# Patient Record
Sex: Male | Born: 1935 | Race: White | Hispanic: No | Smoking: Former smoker
Health system: Southern US, Community
[De-identification: ages and names within clinical notes are randomized; demographics above are authoritative.]

## PROBLEM LIST (undated history)

## (undated) DIAGNOSIS — J449 Chronic obstructive pulmonary disease, unspecified: Secondary | ICD-10-CM

## (undated) DIAGNOSIS — E785 Hyperlipidemia, unspecified: Secondary | ICD-10-CM

## (undated) DIAGNOSIS — C61 Malignant neoplasm of prostate: Secondary | ICD-10-CM

## (undated) DIAGNOSIS — L97509 Non-pressure chronic ulcer of other part of unspecified foot with unspecified severity: Secondary | ICD-10-CM

## (undated) DIAGNOSIS — I519 Heart disease, unspecified: Secondary | ICD-10-CM

## (undated) DIAGNOSIS — I10 Essential (primary) hypertension: Secondary | ICD-10-CM

## (undated) DIAGNOSIS — I5189 Other ill-defined heart diseases: Secondary | ICD-10-CM

## (undated) DIAGNOSIS — I2510 Atherosclerotic heart disease of native coronary artery without angina pectoris: Secondary | ICD-10-CM

## (undated) HISTORY — DX: Essential (primary) hypertension: I10

## (undated) HISTORY — DX: Non-pressure chronic ulcer of other part of unspecified foot with unspecified severity: L97.509

## (undated) HISTORY — DX: Chronic obstructive pulmonary disease, unspecified: J44.9

## (undated) HISTORY — DX: Malignant neoplasm of prostate: C61

## (undated) HISTORY — DX: Atherosclerotic heart disease of native coronary artery without angina pectoris: I25.10

## (undated) HISTORY — DX: Hyperlipidemia, unspecified: E78.5

## (undated) HISTORY — DX: Heart disease, unspecified: I51.9

## (undated) HISTORY — DX: Other ill-defined heart diseases: I51.89

---

## 1994-12-12 HISTORY — PX: CORONARY ARTERY BYPASS GRAFT: SHX141

## 1998-05-11 ENCOUNTER — Other Ambulatory Visit: Admission: RE | Admit: 1998-05-11 | Discharge: 1998-05-11 | Payer: Self-pay | Admitting: Cardiovascular Disease

## 2000-12-12 HISTORY — PX: CARDIAC CATHETERIZATION: SHX172

## 2000-12-12 HISTORY — PX: COLONOSCOPY W/ POLYPECTOMY: SHX1380

## 2001-01-17 ENCOUNTER — Ambulatory Visit (HOSPITAL_COMMUNITY): Admission: RE | Admit: 2001-01-17 | Discharge: 2001-01-18 | Payer: Self-pay | Admitting: Cardiovascular Disease

## 2001-09-03 ENCOUNTER — Encounter (INDEPENDENT_AMBULATORY_CARE_PROVIDER_SITE_OTHER): Payer: Self-pay | Admitting: *Deleted

## 2001-09-03 ENCOUNTER — Ambulatory Visit (HOSPITAL_COMMUNITY): Admission: RE | Admit: 2001-09-03 | Discharge: 2001-09-03 | Payer: Self-pay | Admitting: Gastroenterology

## 2001-10-01 ENCOUNTER — Encounter: Payer: Self-pay | Admitting: General Surgery

## 2001-10-01 ENCOUNTER — Encounter: Admission: RE | Admit: 2001-10-01 | Discharge: 2001-10-01 | Payer: Self-pay | Admitting: General Surgery

## 2001-10-02 ENCOUNTER — Ambulatory Visit (HOSPITAL_BASED_OUTPATIENT_CLINIC_OR_DEPARTMENT_OTHER): Admission: RE | Admit: 2001-10-02 | Discharge: 2001-10-02 | Payer: Self-pay | Admitting: General Surgery

## 2001-10-02 ENCOUNTER — Encounter (INDEPENDENT_AMBULATORY_CARE_PROVIDER_SITE_OTHER): Payer: Self-pay | Admitting: *Deleted

## 2005-08-10 ENCOUNTER — Ambulatory Visit: Payer: Self-pay | Admitting: Internal Medicine

## 2005-09-21 ENCOUNTER — Ambulatory Visit: Payer: Self-pay | Admitting: Internal Medicine

## 2005-11-02 ENCOUNTER — Ambulatory Visit: Payer: Self-pay | Admitting: Internal Medicine

## 2005-11-24 ENCOUNTER — Ambulatory Visit: Payer: Self-pay | Admitting: Internal Medicine

## 2005-12-26 ENCOUNTER — Encounter: Admission: RE | Admit: 2005-12-26 | Discharge: 2005-12-26 | Payer: Self-pay | Admitting: Gynecology

## 2006-01-17 ENCOUNTER — Ambulatory Visit: Payer: Self-pay | Admitting: Internal Medicine

## 2006-02-17 ENCOUNTER — Ambulatory Visit: Payer: Self-pay | Admitting: Internal Medicine

## 2006-04-19 ENCOUNTER — Ambulatory Visit: Payer: Self-pay | Admitting: Internal Medicine

## 2006-05-02 ENCOUNTER — Ambulatory Visit: Payer: Self-pay | Admitting: Gastroenterology

## 2006-05-03 ENCOUNTER — Ambulatory Visit: Payer: Self-pay | Admitting: Gastroenterology

## 2006-05-03 ENCOUNTER — Ambulatory Visit: Payer: Self-pay | Admitting: Internal Medicine

## 2006-07-04 ENCOUNTER — Ambulatory Visit: Payer: Self-pay | Admitting: Internal Medicine

## 2008-03-18 ENCOUNTER — Ambulatory Visit: Payer: Self-pay | Admitting: Internal Medicine

## 2008-03-18 DIAGNOSIS — I10 Essential (primary) hypertension: Secondary | ICD-10-CM | POA: Insufficient documentation

## 2008-03-18 DIAGNOSIS — K219 Gastro-esophageal reflux disease without esophagitis: Secondary | ICD-10-CM

## 2008-03-18 DIAGNOSIS — R05 Cough: Secondary | ICD-10-CM | POA: Insufficient documentation

## 2008-03-18 DIAGNOSIS — J449 Chronic obstructive pulmonary disease, unspecified: Secondary | ICD-10-CM

## 2008-03-18 DIAGNOSIS — E785 Hyperlipidemia, unspecified: Secondary | ICD-10-CM | POA: Insufficient documentation

## 2008-03-24 ENCOUNTER — Telehealth (INDEPENDENT_AMBULATORY_CARE_PROVIDER_SITE_OTHER): Payer: Self-pay | Admitting: *Deleted

## 2008-04-23 ENCOUNTER — Ambulatory Visit: Payer: Self-pay | Admitting: Internal Medicine

## 2008-05-07 ENCOUNTER — Ambulatory Visit: Payer: Self-pay | Admitting: Internal Medicine

## 2008-06-04 ENCOUNTER — Ambulatory Visit: Payer: Self-pay | Admitting: Internal Medicine

## 2008-07-08 ENCOUNTER — Ambulatory Visit: Payer: Self-pay | Admitting: Internal Medicine

## 2008-07-08 DIAGNOSIS — J441 Chronic obstructive pulmonary disease with (acute) exacerbation: Secondary | ICD-10-CM | POA: Insufficient documentation

## 2008-10-21 ENCOUNTER — Ambulatory Visit: Payer: Self-pay | Admitting: Internal Medicine

## 2008-11-03 ENCOUNTER — Telehealth: Payer: Self-pay | Admitting: Adult Health

## 2008-11-04 ENCOUNTER — Ambulatory Visit: Payer: Self-pay | Admitting: Internal Medicine

## 2008-11-14 ENCOUNTER — Telehealth (INDEPENDENT_AMBULATORY_CARE_PROVIDER_SITE_OTHER): Payer: Self-pay | Admitting: *Deleted

## 2008-11-20 ENCOUNTER — Ambulatory Visit: Payer: Self-pay | Admitting: Internal Medicine

## 2008-11-22 ENCOUNTER — Emergency Department (HOSPITAL_COMMUNITY): Admission: EM | Admit: 2008-11-22 | Discharge: 2008-11-22 | Payer: Self-pay | Admitting: Emergency Medicine

## 2008-12-15 ENCOUNTER — Ambulatory Visit: Payer: Self-pay | Admitting: Internal Medicine

## 2009-02-16 ENCOUNTER — Ambulatory Visit: Payer: Self-pay | Admitting: Internal Medicine

## 2009-02-27 ENCOUNTER — Telehealth (INDEPENDENT_AMBULATORY_CARE_PROVIDER_SITE_OTHER): Payer: Self-pay | Admitting: *Deleted

## 2009-06-26 ENCOUNTER — Telehealth (INDEPENDENT_AMBULATORY_CARE_PROVIDER_SITE_OTHER): Payer: Self-pay | Admitting: *Deleted

## 2009-06-26 ENCOUNTER — Ambulatory Visit: Payer: Self-pay | Admitting: Internal Medicine

## 2009-07-27 ENCOUNTER — Encounter: Payer: Self-pay | Admitting: Internal Medicine

## 2009-11-03 ENCOUNTER — Ambulatory Visit: Payer: Self-pay | Admitting: Internal Medicine

## 2010-01-22 ENCOUNTER — Telehealth (INDEPENDENT_AMBULATORY_CARE_PROVIDER_SITE_OTHER): Payer: Self-pay | Admitting: *Deleted

## 2010-01-27 ENCOUNTER — Telehealth (INDEPENDENT_AMBULATORY_CARE_PROVIDER_SITE_OTHER): Payer: Self-pay | Admitting: *Deleted

## 2010-01-28 ENCOUNTER — Ambulatory Visit: Payer: Self-pay | Admitting: Internal Medicine

## 2010-01-28 ENCOUNTER — Telehealth: Payer: Self-pay | Admitting: Internal Medicine

## 2010-02-21 ENCOUNTER — Telehealth (INDEPENDENT_AMBULATORY_CARE_PROVIDER_SITE_OTHER): Payer: Self-pay | Admitting: *Deleted

## 2010-02-22 ENCOUNTER — Telehealth (INDEPENDENT_AMBULATORY_CARE_PROVIDER_SITE_OTHER): Payer: Self-pay | Admitting: *Deleted

## 2010-02-23 ENCOUNTER — Ambulatory Visit: Payer: Self-pay | Admitting: Internal Medicine

## 2010-03-01 ENCOUNTER — Telehealth (INDEPENDENT_AMBULATORY_CARE_PROVIDER_SITE_OTHER): Payer: Self-pay | Admitting: *Deleted

## 2010-03-02 ENCOUNTER — Ambulatory Visit: Payer: Self-pay | Admitting: Internal Medicine

## 2010-03-02 ENCOUNTER — Ambulatory Visit: Payer: Self-pay | Admitting: Cardiovascular Disease

## 2010-03-11 ENCOUNTER — Ambulatory Visit: Payer: Self-pay | Admitting: Internal Medicine

## 2010-04-13 ENCOUNTER — Ambulatory Visit (HOSPITAL_COMMUNITY): Admission: RE | Admit: 2010-04-13 | Discharge: 2010-04-13 | Payer: Self-pay | Admitting: Urology

## 2010-04-29 ENCOUNTER — Ambulatory Visit: Payer: Self-pay | Admitting: Internal Medicine

## 2010-05-07 ENCOUNTER — Ambulatory Visit: Admission: RE | Admit: 2010-05-07 | Discharge: 2010-05-24 | Payer: Self-pay | Admitting: Radiation Oncology

## 2010-07-05 ENCOUNTER — Telehealth: Payer: Self-pay | Admitting: Internal Medicine

## 2010-07-05 ENCOUNTER — Ambulatory Visit: Payer: Self-pay | Admitting: Internal Medicine

## 2010-07-27 ENCOUNTER — Ambulatory Visit
Admission: RE | Admit: 2010-07-27 | Discharge: 2010-10-14 | Payer: Self-pay | Source: Home / Self Care | Admitting: Radiation Oncology

## 2010-08-23 ENCOUNTER — Telehealth (INDEPENDENT_AMBULATORY_CARE_PROVIDER_SITE_OTHER): Payer: Self-pay | Admitting: *Deleted

## 2010-09-06 ENCOUNTER — Ambulatory Visit: Payer: Self-pay | Admitting: Internal Medicine

## 2010-09-13 ENCOUNTER — Ambulatory Visit: Payer: Self-pay | Admitting: Cardiovascular Disease

## 2010-09-22 ENCOUNTER — Telehealth (INDEPENDENT_AMBULATORY_CARE_PROVIDER_SITE_OTHER): Payer: Self-pay | Admitting: *Deleted

## 2010-10-26 ENCOUNTER — Ambulatory Visit: Payer: Self-pay | Admitting: Pulmonary Disease

## 2010-10-26 ENCOUNTER — Telehealth (INDEPENDENT_AMBULATORY_CARE_PROVIDER_SITE_OTHER): Payer: Self-pay | Admitting: *Deleted

## 2010-11-23 ENCOUNTER — Telehealth (INDEPENDENT_AMBULATORY_CARE_PROVIDER_SITE_OTHER): Payer: Self-pay | Admitting: *Deleted

## 2011-01-05 ENCOUNTER — Ambulatory Visit: Payer: Self-pay | Admitting: Cardiovascular Disease

## 2011-01-07 ENCOUNTER — Ambulatory Visit
Admission: RE | Admit: 2011-01-07 | Discharge: 2011-01-07 | Payer: Self-pay | Source: Home / Self Care | Attending: Internal Medicine | Admitting: Internal Medicine

## 2011-01-13 NOTE — Assessment & Plan Note (Signed)
Summary: Pulmonary/ acute ov with hfa @ 100%   Primary Provider/Referring Provider:  Jacky Kindle MD, [facility]  CC:  Acute visit.  Pt c/o wheezing and increased SOB x 4 days.  He gets out of breath just walking approx 200 ft.  Past few days has used combivent 3 times a day- not helping like it used to.Marland Kitchen  History of Present Illness:  77-yowm quit smoking age 77  with chronic cough and evidence of reversible air flow obstruction by PFTs who did not improve on Spiriva, but seemed much better in terms of dyspnea on Advair, but worse in terms of hoarseness.    Chart review did indicate that he had egd with  large hiatal hernia with esophageal stricture and GERD and Dr. Jarold Motto recommended that he maintain on Nexium beforeakfast daily,  which he had not been doing consistently so rec PPI perfectly regularly and switched from Advair to Symbicort and returned 06/04/08  stating that his breathing was the best it's been in years and his hoarseness is also much better.  February 23, 2010 Acute visit.  Pt c/o prod cough with white to yellow sputum x 4 days.  He states that when he breaths his chest "rattles".  He had rx for amocicillin 500 mg and started taking this bid for the past 2 days. rec change to combivent and 6 days prednisone.  March 02, 2010 Acute visit.  Pt states that he was doing better with breathing until finished prednisone taper.  He c/o increased SOB x 2 days.  He also c/o increased cough- non prod.  CT was therefore done and neg.     Apr 29, 2010 follow up, pt states his bretahing is better still have SOB with activity, pt states he was just diagnosed with protstate  cancer and wants to know if that will affect his bretahing any bc of the radiation adn the hormone shots he will be getting.  sleeping good only needs combivent around 5pm, no am exac or sign cough.    July 05, 2010 Acute visit.  Pt c/o wheezing and increased SOB x 4 days.  He gets out of breath just walking approx 200 ft.  Past  few days has used combivent 3 times a day- not helping like it used to. no purulent sputum. Pt denies any significant sore throat, dysphagia, itching, sneezing,  nasal congestion or excess secretions,  fever, chills, sweats, unintended wt loss, pleuritic or exertional cp, hempoptysis,  orthopnea pnd or leg swelling   Current Medications (verified): 1)  Coq-10 30 Mg  Caps (Coenzyme Q10) .... Once Daily 2)  Cvs Childrens Complete 60 Mg Chew (Pediatric Multivit-Minerals-C) .Marland Kitchen.. 1 Once Daily 3)  Omeprazole 20 Mg Tbec (Omeprazole) .... Take 1 Tablet By Mouth Once A Day 30 Minutes Before Meal 4)  Metoprolol Tartrate 50 Mg Tabs (Metoprolol Tartrate) .... 1/2 Tab By Mouth Twice Daily 5)  Simvastatin 20 Mg  Tabs (Simvastatin) .... Once Daily 6)  Hydrochlorothiazide 25 Mg Tabs (Hydrochlorothiazide) .... 1/2 Tab By Mouth Once Daily 7)  Benicar 20 Mg  Tabs (Olmesartan Medoxomil) .... 1/2 Once Daily 8)  Symbicort 160-4.5 Mcg/act  Aero (Budesonide-Formoterol Fumarate) .... Two Puffs Each Am and 2 Puffs Each Pm 9)  Adult Aspirin Low Strength 81 Mg  Tbdp (Aspirin) .... Once Daily 10)  Acidophilus Probiotic Blend  Caps (Misc Intestinal Flora Regulat) .Marland Kitchen.. 1 Once Daily As Needed 11)  Mucinex Dm 30-600 Mg Xr12h-Tab (Dextromethorphan-Guaifenesin) .Marland Kitchen.. 1-2 Every 12 Hours As Needed 12)  Loratadine 10 Mg Tabs (Loratadine) .... Take 1 Tablet By Mouth Once A Day As Needed Seasonal Allergies 13)  Pepcid 20 Mg Tabs (Famotidine) .... One At Bedtime 14)  Gabapentin 100 Mg Caps (Gabapentin) .Marland Kitchen.. 1 Once Daily As Needed 15)  Vitamin D 1000 Unit Tabs (Cholecalciferol) .Marland Kitchen.. 1 Once Daily 16)  Combivent 18-103 Mcg/act Aero (Ipratropium-Albuterol) .... 2 Puffs As Needed For Short of Breath,  Cough Wheeze Congestion 17)  Lexapro 10 Mg Tabs (Escitalopram Oxalate) .... Once Daily  Allergies (verified): No Known Drug Allergies  Past History:  Past Medical History: GERD  (ICD-530.81)...................................................................Marland KitchenJarold Motto     - See positive endoscopy report 05/03/2006 showing esophageal stricture and hiatal hernia CAD (ICD-414.00) HYPERTENSION (ICD-401.9) HYPERLIPIDEMIA (ICD-272.4) COUGH, CHRONIC (ICD-786.2)     - Sinus CT neg March 02, 2010  COPD (ICD-496)........................................................................Marland KitchenWert    - PFTs 06/04/08 FEV1 51% ratio 36% diffusing capacity 74% 12% response to bronchodilator    - HFA 75% January 28, 2010 >  February 23, 2010 75% > 100% March 02, 2010 > confirmed July 05, 2010  Complex med regimen    -Meds reviewed with pt education and computerized med calendar completed/ -December 15, 2008, adjusted March 11, 2010      Vital Signs:  Patient profile:   77 year old male Weight:      169 pounds O2 Sat:      97 % on Room air Temp:     98.0 degrees F oral Pulse rate:   75 / minute BP sitting:   116 / 80  (left arm)  Vitals Entered By: Vernie Murders (July 05, 2010 11:55 AM)  O2 Flow:  Room air  Physical Exam  Additional Exam:  GEN: A/Ox3; pleasant , NAD wt 176 > 172 February 23, 2010 > 171 March 02, 2010 >>172 March 11, 2010> 168 Apr 29, 2010 > 169 July 05, 2010  HEENT:  [place]/AT, , EACs-clear, TMs-wnl, NOSE-clear, THROAT-clear NECK:  Supple w/ fair ROM; no JVD; normal carotid impulses w/o bruits; no thyromegaly or nodules palpated; no lymphadenopathy. RESP Distant bs, no wheeze, mild increase T exp  CARD:  RRR, no m/r/g   GI:   Soft & nt; nml bowel sounds; no organomegaly or masses detected. Musco: Warm bil,  no calf tenderness edema, clubbing, pulses intact     Impression & Recommendations:  Problem # 1:  CHRONIC OBSTRUCTIVE PULMONARY DISEASE, ACUTE EXACERBATION (ICD-491.21)  I spent extra time with the patient today explaining optimal mdi  technique.  This improved from  75- 100%   Each maintenance medication was reviewed in detail including most importantly the  difference between maintenance and as needed and under what circumstances the prns are to be used. This was done in the context of a medication calendar review which provided the patient with a user-friendly unambiguous mechanism for medication administration and reconciliation and provides an action plan for all active problems. It is critical that this be shown to every doctor  for modification during the office visit if necessary so the patient can use it as a working document.  See instructions for specific recommendations   Orders: Est. Patient Level III (40981)  Medications Added to Medication List This Visit: 1)  Cvs Childrens Complete 60 Mg Chew (Pediatric multivit-minerals-c) .Marland Kitchen.. 1 once daily 2)  Prednisone 10 Mg Tabs (Prednisone) .... 4 each am x 2days, 2x2days, 1x2days and stop  Other Orders: Prescription Created Electronically [phone])  Patient Instructions: 1)  Prednisone 4 each am x 2days, 2x2days,  1x2days and stop - if not improving call for office visit  Prescriptions: PREDNISONE 10 MG  TABS (PREDNISONE) 4 each am x 2days, 2x2days, 1x2days and stop  #14 x 0   Entered and Authorized by:   Nyoka Cowden MD   Signed by:   Nyoka Cowden MD on 07/05/2010   Method used:   Electronically to        [facility]* (retail)       [address]       [place], [place]  [id number]       Ph: [phone]       Fax: [phone]   RxID:   [id number]

## 2011-01-13 NOTE — Assessment & Plan Note (Signed)
Summary: Acute NP office visit - COPD   Primary Provider/Referring Provider:  Jacky Kindle MD, [facility]  CC:  wheezing, increased SOB, prod cough w/ yellow mucus, and occ f/c/s x3days - was given zpak yesterday.Marland Kitchen  History of Present Illness:  77-yowm quit smoking age 77  with chronic cough and evidence of reversible air flow obstruction by PFTs who did not improve on Spiriva, but seemed much better in terms of dyspnea on Advair, but worse in terms of hoarseness.    Chart review did indicate that he had egd with  large hiatal hernia with esophageal stricture and GERD and Dr. Jarold Motto recommended that he maintain on Nexium beforeakfast daily,  which he had not been doing consistently so rec PPI perfectly regularly and switched from Advair to Symbicort and returned 06/04/08  stating that his breathing was the best it's been in years and his hoarseness is also much better.  February 23, 2010 Acute visit.  Pt c/o prod cough with white to yellow sputum x 4 days.  He states that when he breaths his chest "rattles".  He had rx for amocicillin 500 mg and started taking this bid for the past 2 days. rec change to combivent and 6 days prednisone.  March 02, 2010 Acute visit.  Pt states that he was doing better with breathing until finished prednisone taper.  He c/o increased SOB x 2 days.  He also c/o increased cough- non prod.  CT was therefore done and neg.     Apr 29, 2010 follow up, pt states his bretahing is better still have SOB with activity, pt states he was just diagnosed with protstate  cancer and wants to know if that will affect his bretahing any bc of the radiation adn the hormone shots he will be getting.  sleeping good only needs combivent around 5pm, no am exac or sign cough.    July 05, 2010 Acute visit.  Pt c/o wheezing and increased SOB x 4 days.  He gets out of breath just walking approx 200 ft.  Past few days has used combivent 3 times a day- not helping like it used to. no purulent sputum.   September 06, 2010--Presents for follow up. Had some increased wheezing was given steroid taper 3 weeks ago with improvement in breathing.  Has recently been  dx w/ prostate cancer. He is receiving injections and radiation . Says his breathing has returned to baseline. He does wear out easily. Not much energy since starting radiation tx. >>no changes in rx   October 26, 2010-Presents for an acute office visit. Complains of wheezing, increased SOB, prod cough w/ yellow mucus, occ f/c/s x3days - was given zpak yesterday. Complains of 3 days of cough, congestion, wheezing. Was working in yard with leaves few days ago. Seems to start after he was out in leaves and wind. Denies chest pain,  orthopnea, hemoptysis,  n/v/d, edema, headache,recent travel or antibiotics.  Has felt chilled and feverish.  Took some left over cough syrup with no help. Does not feel better w/ 1 day of zpack   Medications Prior to Update: 1)  Coq-10 30 Mg  Caps (Coenzyme Q10) .... Once Daily 2)  Centrum Silver  Tabs (Multiple Vitamins-Minerals) .... Take 1 Tablet By Mouth Once A Day 3)  Omeprazole 20 Mg Tbec (Omeprazole) .... Take 1 Tablet By Mouth Once A Day 30 Minutes Before Meal 4)  Metoprolol Tartrate 50 Mg Tabs (Metoprolol Tartrate) .... 1/2 Tab By Mouth Twice Daily 5)  Simvastatin  20 Mg  Tabs (Simvastatin) .... Once Daily 6)  Hydrochlorothiazide 25 Mg Tabs (Hydrochlorothiazide) .... 1/2 Tab By Mouth Once Daily 7)  Benicar 20 Mg  Tabs (Olmesartan Medoxomil) .... 1/2 Once Daily 8)  Symbicort 160-4.5 Mcg/act  Aero (Budesonide-Formoterol Fumarate) .... Two Puffs Each Am and 2 Puffs Each Pm 9)  Adult Aspirin Low Strength 81 Mg  Tbdp (Aspirin) .... Once Daily 10)  Pepcid 20 Mg Tabs (Famotidine) .... One At Bedtime 11)  Vision Vitamins  Tabs (Multiple Vitamins-Minerals) .... Take 1 Tablet By Mouth Once A Day 12)  Vitamin D 1000 Unit Tabs (Cholecalciferol) .Marland Kitchen.. 1 Once Daily 13)  Gabapentin 100 Mg Caps (Gabapentin) .... Take 1  Capsule By Mouth At Bedtime 14)  Tumeric Vitamin 500mg  .... Take 1 Tablet By Mouth Once A Day 15)  Lexapro 10 Mg Tabs (Escitalopram Oxalate) .... Once Daily 16)  Combivent 18-103 Mcg/act Aero (Ipratropium-Albuterol) .... 2 Puffs Every 3 Hours As Needed For Short of Breath,  Cough Wheeze Congestion 17)  Melatonin 1 Mg Caps (Melatonin) .... Take 1 Tab By Mouth At Bedtime As Needed 18)  Mucinex Dm 30-600 Mg Xr12h-Tab (Dextromethorphan-Guaifenesin) .Marland Kitchen.. 1-2 Every 12 Hours As Needed 19)  Acidophilus Probiotic Blend  Caps (Misc Intestinal Flora Regulat) .Marland Kitchen.. 1 Once Daily As Needed 20)  Benicar 20 Mg Tabs (Olmesartan Medoxomil) .... An Extra 1/2 Tab Daily 21)  Gelnique 10 % Gel (Oxybutynin Chloride) .Marland Kitchen.. 1 Pump Once Daily 22)  Loratadine 10 Mg Tabs (Loratadine) .... Take 1 Tablet By Mouth Once A Day As Needed Seasonal Allergies 23)  Prednisone 10 Mg Tabs (Prednisone) .... 4 Tabs For 2 Days, Then 3 Tabs For 2 Days, 2 Tabs For 2 Days, Then 1 Tab For 2 Days, Then Stop  Current Medications (verified): 1)  Coq-10 30 Mg  Caps (Coenzyme Q10) .... Once Daily 2)  Centrum Silver  Tabs (Multiple Vitamins-Minerals) .... Take 1 Tablet By Mouth Once A Day 3)  Omeprazole 20 Mg Tbec (Omeprazole) .... Take 1 Tablet By Mouth Once A Day 30 Minutes Before Meal 4)  Metoprolol Tartrate 50 Mg Tabs (Metoprolol Tartrate) .... 1/2 Tab By Mouth Twice Daily 5)  Simvastatin 20 Mg  Tabs (Simvastatin) .... Once Daily 6)  Hydrochlorothiazide 25 Mg Tabs (Hydrochlorothiazide) .... 1/2 Tab By Mouth Once Daily 7)  Benicar 20 Mg  Tabs (Olmesartan Medoxomil) .... 1/2 Once Daily 8)  Symbicort 160-4.5 Mcg/act  Aero (Budesonide-Formoterol Fumarate) .... Two Puffs Each Am and 2 Puffs Each Pm 9)  Adult Aspirin Low Strength 81 Mg  Tbdp (Aspirin) .... Once Daily 10)  Pepcid 20 Mg Tabs (Famotidine) .... One At Bedtime 11)  Vision Vitamins  Tabs (Multiple Vitamins-Minerals) .... Take 1 Tablet By Mouth Once A Day 12)  Vitamin D 1000 Unit Tabs  (Cholecalciferol) .Marland Kitchen.. 1 Once Daily 13)  Gabapentin 100 Mg Caps (Gabapentin) .... Take 1 Capsule By Mouth At Bedtime 14)  Tumeric Vitamin 500mg  .... Take 1 Tablet By Mouth Once A Day 15)  Lexapro 10 Mg Tabs (Escitalopram Oxalate) .... Once Daily 16)  Combivent 18-103 Mcg/act Aero (Ipratropium-Albuterol) .... 2 Puffs Every 3 Hours As Needed For Short of Breath,  Cough Wheeze Congestion 17)  Melatonin 1 Mg Caps (Melatonin) .... Take 1 Tab By Mouth At Bedtime As Needed 18)  Mucinex Dm 30-600 Mg Xr12h-Tab (Dextromethorphan-Guaifenesin) .Marland Kitchen.. 1-2 Every 12 Hours As Needed 19)  Acidophilus Probiotic Blend  Caps (Misc Intestinal Flora Regulat) .Marland Kitchen.. 1 Once Daily As Needed 20)  Benicar 20 Mg Tabs (Olmesartan  Medoxomil) .... An Extra 1/2 Tab Daily 21)  Gelnique 10 % Gel (Oxybutynin Chloride) .Marland Kitchen.. 1 Pump Once Daily 22)  Loratadine 10 Mg Tabs (Loratadine) .... Take 1 Tablet By Mouth Once A Day As Needed Seasonal Allergies  Allergies (verified): No Known Drug Allergies  Past History:  Past Medical History: Last updated: 07/05/2010 GERD (ICD-530.81)...................................................................Marland KitchenJarold Motto     - See positive endoscopy report 05/03/2006 showing esophageal stricture and hiatal hernia CAD (ICD-414.00) HYPERTENSION (ICD-401.9) HYPERLIPIDEMIA (ICD-272.4) COUGH, CHRONIC (ICD-786.2)     - Sinus CT neg March 02, 2010  COPD (ICD-496)........................................................................Marland KitchenWert    - PFTs 06/04/08 FEV1 51% ratio 36% diffusing capacity 74% 12% response to bronchodilator    - HFA 75% January 28, 2010 >  February 23, 2010 75% > 100% March 02, 2010 > confirmed July 05, 2010  Complex med regimen    -Meds reviewed with pt education and computerized med calendar completed/ -December 15, 2008, adjusted March 11, 2010      Family History: Last updated: 11/20/2008 Ht dz father, brother Neg resp dz, atopy  Social History: Last updated:  11/03/2009 Quit smoking 1980; pipe smoker x15-68yrs. alcohol - 1 drink daily (Vodka) no caffeine widowed 2 children retired Programme researcher, broadcasting/film/video for [employer]  Risk Factors: Smoking Status: quit (03/18/2008)  Review of Systems      See HPI  Vital Signs:  Patient profile:   78 year old male Height:      69 inches Weight:      167.25 pounds BMI:     24.79 O2 Sat:      95 % on Room air Temp:     96.9 degrees F oral Pulse rate:   101 / minute BP sitting:   164 / 90  (left arm) Cuff size:   regular  Vitals Entered By: Boone Master CNA/MA (October 26, 2010 10:42 AM)  O2 Flow:  Room air CC: wheezing, increased SOB, prod cough w/ yellow mucus, occ f/c/s x3days - was given zpak yesterday. Is Patient Diabetic? No Comments Medications reviewed with patient Daytime contact number verified with patient. Boone Master CNA/MA  October 26, 2010 10:42 AM    Physical Exam  Additional Exam:  GEN: A/Ox3; pleasant , NAD wt 176 > 172 February 23, 2010 > 171 March 02, 2010 >>172 March 11, 2010> 168 Apr 29, 2010 > 169 July 05, 2010 >>175 09/06/10  HEENT:  [place]/AT, , EACs-clear, TMs-wnl, NOSE-clear, THROAT-clear NECK:  Supple w/ fair ROM; no JVD; normal carotid impulses w/o bruits; no thyromegaly or nodules palpated; no lymphadenopathy. RESP Coarse BS w/ faint exp wheeze  CARD:  RRR, no m/r/g   GI:   Soft & nt; nml bowel sounds; no organomegaly or masses detected. Musco: Warm bil,  no calf tenderness edema, clubbing, pulses intact     Impression & Recommendations:  Problem # 1:  CHRONIC OBSTRUCTIVE PULMONARY DISEASE, ACUTE EXACERBATION (ICD-491.21) Flare xopenex neb given in office  Plan Finish Zpack  Mucinex DM two times a day as needed cough/congestion Increase fluids and rest  Tylenol as needed fever  Prednisone taper over next week.  Please contact office for sooner follow up if symptoms do not improve or worsen  follow up Dr. Sherene Sires in 6 weeks   Orders: Est. Patient Level  IV (24401)  Medications Added to Medication List This Visit: 1)  Prednisone 10 Mg Tabs (Prednisone) .... 4 tabs for 2 days, then 3 tabs for 2 days, 2 tabs for 2 days, then 1 tab for  2 days, then stop  Complete Medication List: 1)  Coq-10 30 Mg Caps (Coenzyme q10) .... Once daily 2)  Centrum Silver Tabs (Multiple vitamins-minerals) .... Take 1 tablet by mouth once a day 3)  Omeprazole 20 Mg Tbec (Omeprazole) .... Take 1 tablet by mouth once a day 30 minutes before meal 4)  Metoprolol Tartrate 50 Mg Tabs (Metoprolol tartrate) .... 1/2 tab by mouth twice daily 5)  Simvastatin 20 Mg Tabs (Simvastatin) .... Once daily 6)  Hydrochlorothiazide 25 Mg Tabs (Hydrochlorothiazide) .... 1/2 tab by mouth once daily 7)  Benicar 20 Mg Tabs (Olmesartan medoxomil) .... 1/2 once daily 8)  Symbicort 160-4.5 Mcg/act Aero (Budesonide-formoterol fumarate) .... Two puffs each am and 2 puffs each pm 9)  Adult Aspirin Low Strength 81 Mg Tbdp (Aspirin) .... Once daily 10)  Pepcid 20 Mg Tabs (Famotidine) .... One at bedtime 11)  Vision Vitamins Tabs (Multiple vitamins-minerals) .... Take 1 tablet by mouth once a day 12)  Vitamin D 1000 Unit Tabs (Cholecalciferol) .Marland Kitchen.. 1 once daily 13)  Gabapentin 100 Mg Caps (Gabapentin) .... Take 1 capsule by mouth at bedtime 14)  Tumeric Vitamin 500mg   .... Take 1 tablet by mouth once a day 15)  Lexapro 10 Mg Tabs (Escitalopram oxalate) .... Once daily 16)  Combivent 18-103 Mcg/act Aero (Ipratropium-albuterol) .... 2 puffs every 3 hours as needed for short of breath,  cough wheeze congestion 17)  Melatonin 1 Mg Caps (Melatonin) .... Take 1 tab by mouth at bedtime as needed 18)  Mucinex Dm 30-600 Mg Xr12h-tab (Dextromethorphan-guaifenesin) .Marland Kitchen.. 1-2 every 12 hours as needed 19)  Acidophilus Probiotic Blend Caps (Misc intestinal flora regulat) .Marland Kitchen.. 1 once daily as needed 20)  Benicar 20 Mg Tabs (Olmesartan medoxomil) .... An extra 1/2 tab daily 21)  Gelnique 10 % Gel (Oxybutynin  chloride) .Marland Kitchen.. 1 pump once daily 22)  Loratadine 10 Mg Tabs (Loratadine) .... Take 1 tablet by mouth once a day as needed seasonal allergies 23)  Prednisone 10 Mg Tabs (Prednisone) .... 4 tabs for 2 days, then 3 tabs for 2 days, 2 tabs for 2 days, then 1 tab for 2 days, then stop  Patient Instructions: 1)  Finish Zpack  2)  Mucinex DM two times a day as needed cough/congestion 3)  Increase fluids and rest  4)  Tylenol as needed fever  5)  Prednisone taper over next week.  6)  Please contact office for sooner follow up if symptoms do not improve or worsen  7)  follow up Dr. Sherene Sires in 6 weeks  Prescriptions: COMBIVENT 18-103 MCG/ACT AERO (IPRATROPIUM-ALBUTEROL) 2 puffs every 3 hours as needed for short of breath,  cough wheeze congestion  #1 x 3   Entered and Authorized by:   Rubye Oaks NP   Signed by:   Rubye Oaks NP on 10/26/2010   Method used:   Electronically to        [facility]* (retail)       [address]       [place], [place]  [address]       Ph: [phone]       Fax: [phone]   RxID:   [id number] PREDNISONE 10 MG TABS (PREDNISONE) 4 tabs for 2 days, then 3 tabs for 2 days, 2 tabs for 2 days, then 1 tab for 2 days, then stop  #20 x 0   Entered and Authorized by:   Rubye Oaks NP   Signed by:   Rubye Oaks NP on 10/26/2010  Method used:   Electronically to        [facility]* (retail)       [address]       [place], [place]  [id number]       Ph: [phone]       Fax: [phone]   RxID:   [id number]   Appended Document: Nebulizer treatment     Clinical Lists Changes  Orders: Added new Service order of Nebulizer Tx ([id number]) - Signed       Medication Administration  Medication # 1:    Medication: Xopenex 1.25mg     Diagnosis: CHRONIC OBSTRUCTIVE PULMONARY DISEASE, ACUTE EXACERBATION (ICD-491.21)    Dose: 1 vial    Route: inhaled    Exp Date: 08-12    Lot #: [id number]    Mfr: SEPRACOR    Patient  tolerated medication without complications    Given by: Elray Buba RN (October 26, 2010 11:30 AM)  Orders Added: 1)  Nebulizer Tx [phone]

## 2011-01-13 NOTE — Assessment & Plan Note (Signed)
Summary: Pulmonary/ copd with hfa 75%   Primary Provider/Referring Provider:  Jacky Kindle MD, [facility]  CC:  Cough and Dyspnea- better.  History of Present Illness:  87yowm  quit smoking age 77  GOLD II/III COPD with chronic cough and evidence of partially reversible airflow obstruction by PFTs who did not improve on Spiriva, but seemed much better in terms of dyspnea on Advair, but worse in terms of hoarseness.    Chart review did indicate that he had egd with  large hiatal hernia with esophageal stricture and GERD and Dr. Jarold Motto recommended that he maintain on Nexium beforeakfast daily,  which he had not been doing consistently so rec PPI perfectly regularly and switched from Advair to Symbicort and returned 06/04/08  stating that his breathing was the best it's been in years and his hoarseness is also much better.  March 02, 2010 Acute visit.  Pt states that he was doing better with breathing until finished prednisone taper.  He c/o increased SOB x 2 days.  He also c/o increased cough- non prod.  CT was therefore done and neg.     May 19, 2011ov sob  better still   with activity, pt states he was just diagnosed with protstate  cancer and wants to know if that will affect his bretahing any bc of the radiation adn the hormone shots he will be getting.  sleeping good only needs combivent around 5pm, no am exac or sign cough.     see page 2 September 06, 2010--Presents for follow up. Had some increased wheezing was given steroid taper 3 weeks ago with improvement in breathing.  Has recently been  dx w/ prostate cancer. He is receiving injections and radiation . Says his breathing has returned to baseline. He does wear out easily. Not much energy since starting radiation tx. >>no changes in rx   October 26, 2010-cc  wheezing, increased SOB, prod cough w/ yellow mucus, occ f/c/s x3days - was given zpak yesterday. Complains of 3 days of cough, congestion, wheezing. Was working in yard with leaves  few days ago. Seems to start after he was out in leaves and wind. rec Finish Zpack  Mucinex DM two times a day as needed cough/congestion Increase fluids and rest  Tylenol as needed fever  Prednisone taper over next week.   January 07, 2011 ov Cough and Dyspnea- better,  using symbicort 2bid and maybe combivent after ex never before - no purulen sputum. Pt denies any significant sore throat, dysphagia, itching, sneezing,  nasal congestion or excess secretions,  fever, chills, sweats, unintended wt loss, pleuritic or exertional cp, hempoptysis, change in activity tolerance  orthopnea pnd or leg swelling. Pt also denies any obvious fluctuation in symptoms with weather or environmental change or other alleviating or aggravating factors.       Current Medications (verified): 1)  Coq-10 30 Mg  Caps (Coenzyme Q10) .... Once Daily 2)  Centrum Silver  Tabs (Multiple Vitamins-Minerals) .... Take 1 Tablet By Mouth Once A Day 3)  Omeprazole 20 Mg Tbec (Omeprazole) .... Take 1 Tablet By Mouth Once A Day 30 Minutes Before Meal 4)  Metoprolol Tartrate 50 Mg Tabs (Metoprolol Tartrate) .... 1/2 Tab By Mouth Twice Daily 5)  Simvastatin 20 Mg  Tabs (Simvastatin) .... Once Daily 6)  Hydrochlorothiazide 25 Mg Tabs (Hydrochlorothiazide) .... 1/2 Tab By Mouth Once Daily 7)  Benicar 20 Mg  Tabs (Olmesartan Medoxomil) .... 1/2 Once Daily 8)  Symbicort 160-4.5 Mcg/act  Aero (Budesonide-Formoterol Fumarate) .Marland KitchenMarland KitchenMarland Kitchen  Two Puffs Each Am and 2 Puffs Each Pm 9)  Adult Aspirin Low Strength 81 Mg  Tbdp (Aspirin) .... Once Daily 10)  Pepcid 20 Mg Tabs (Famotidine) .... One At Bedtime 11)  Vision Vitamins  Tabs (Multiple Vitamins-Minerals) .... Take 1 Tablet By Mouth Once A Day 12)  Vitamin D 1000 Unit Tabs (Cholecalciferol) .Marland Kitchen.. 1 Once Daily 13)  Gabapentin 100 Mg Caps (Gabapentin) .... Take 1 Capsule By Mouth At Bedtime 14)  Combivent 18-103 Mcg/act Aero (Ipratropium-Albuterol) .... 2 Puffs Every 3 Hours As Needed For Short of  Breath,  Cough Wheeze Congestion 15)  Melatonin 1 Mg Caps (Melatonin) .... Take 1 Tab By Mouth At Bedtime As Needed 16)  Mucinex Dm 30-600 Mg Xr12h-Tab (Dextromethorphan-Guaifenesin) .Marland Kitchen.. 1-2 Every 12 Hours As Needed 17)  Acidophilus Probiotic Blend  Caps (Misc Intestinal Flora Regulat) .Marland Kitchen.. 1 Once Daily As Needed 18)  Benicar 20 Mg Tabs (Olmesartan Medoxomil) .... An Extra 1/2 Tab Daily 19)  Magnesium Cittrate 150 Mg .Marland Kitchen.. 1 Two Times A Day  Allergies (verified): No Known Drug Allergies  Past History:  Past Medical History: GERD (ICD-530.81)...................................................................Marland KitchenJarold Motto     - See positive endoscopy report 05/03/2006 showing esophageal stricture and hiatal hernia CAD (ICD-414.00) HYPERTENSION (ICD-401.9) HYPERLIPIDEMIA (ICD-272.4) COUGH, CHRONIC (ICD-786.2)     - Sinus CT neg March 02, 2010  COPD (ICD-496)..........................................................................Marland KitchenWert    - PFTs 06/04/08 FEV1 51% ratio 36% diffusing capacity 74% 12% response to bronchodilator    - HFA 75% January 28, 2010 >  75% January 07, 2011  Complex med regimen    -Meds reviewed with pt education and computerized med calendar completed/ -December 15, 2008, adjusted March 11, 2010      Vital Signs:  Patient profile:   77 year old male Weight:      163 pounds O2 Sat:      96 % on Room air Pulse rate:   94 / minute BP sitting:   110 / 72  (left arm)  Vitals Entered By: Vernie Murders (January 07, 2011 11:37 AM)  O2 Flow:  Room air  Physical Exam  Additional Exam:  GEN: A/Ox3; pleasant , NAD wt 176 > 172 February 23, 2010 > 169 July 05, 2010 >>175 09/06/10 > 163 January 07, 2011  HEENT:  [place]/AT, , EACs-clear, TMs-wnl, NOSE-clear, THROAT-clear NECK:  Supple w/ fair ROM; no JVD; normal carotid impulses w/o bruits; no thyromegaly or nodules palpated; no lymphadenopathy. RESP Coarse BS w/ faint exp wheeze  CARD:  RRR, no m/r/g   GI:   Soft & nt; nml  bowel sounds; no organomegaly or masses detected. Musco: Warm bil,  no calf tenderness edema, clubbing, pulses intact     Impression & Recommendations:  Problem # 1:  COPD (ICD-496) GOLD II/II with freq exac    DDX of  difficult airways managment all start with A and  include Adherence, Ace Inhibitors, Acid Reflux, Active Sinus Disease, Alpha 1 Antitripsin deficiency, Anxiety masquerading as Airways dz,  ABPA,  allergy(esp in young), Aspiration (esp in elderly), Adverse effects of DPI,  Active smokers, plus two Bs  = Bronchiectasis and Beta blocker use..and one C= CHF    Adherence:  I spent extra time with the patient today explaining optimal mdi  technique.  This improved from  50-75% not as good as he had previously with option:   change to Brovana and Budesonide, the equivalent of Symbicort, per neb.    Each maintenance medication was reviewed in detail including most  importantly the difference between maintenance and as needed and under what circumstances the prns are to be used. See instructions for specific recommendations   Medications Added to Medication List This Visit: 1)  Magnesium Cittrate 150 Mg  .Marland Kitchen.. 1 two times a day 2)  Prednisone 10 Mg Tabs (Prednisone) .... 4 each am x 2days, 2x2days, 1x2days and stop  Other Orders: Est. Patient Level III (16109) HFA Instruction [phone]) Prescription Created Electronically [phone])  Patient Instructions: 1)  Try using combivent 2 puffs before planned activity to see if it improves your performance 2)  Prednisone 10mg  4 each am x 2days, 2x2days, 1x2days and stop in event of worse breathing that doesn't respond to combivent  3)  Work on perfecting  inhaler technique:  relax and blow all the way out then take a nice smooth deep breath back in, triggering the inhaler at same time you start breathing in  4)  Return to office in 3 months, sooner if needed  Prescriptions: PREDNISONE 10 MG  TABS (PREDNISONE) 4 each am x 2days, 2x2days,  1x2days and stop  #14 x 05   Entered and Authorized by:   Nyoka Cowden MD   Signed by:   Nyoka Cowden MD on 01/07/2011   Method used:   Electronically to        [facility]* (retail)       [address]       [place], [place]  [id number]       Ph: [phone]       Fax: [phone]   RxID:   [id number]

## 2011-01-13 NOTE — Assessment & Plan Note (Signed)
Summary: NP follow up - COPD   Primary Provider/Referring Provider:  Jacky Kindle MD, [facility]  CC:  follow up .  History of Present Illness:  77-yowm quit smoking age 77  with chronic cough and evidence of reversible air flow obstruction by PFTs who did not improve on Spiriva, but seemed much better in terms of dyspnea on Advair, but worse in terms of hoarseness.    Chart review did indicate that he had egd with  large hiatal hernia with esophageal stricture and GERD and Dr. Jarold Motto recommended that he maintain on Nexium beforeakfast daily,  which he had not been doing consistently so rec PPI perfectly regularly and switched from Advair to Symbicort and returned 06/04/08  stating that his breathing was the best it's been in years and his hoarseness is also much better.  February 23, 2010 Acute visit.  Pt c/o prod cough with white to yellow sputum x 4 days.  He states that when he breaths his chest "rattles".  He had rx for amocicillin 500 mg and started taking this bid for the past 2 days. rec change to combivent and 6 days prednisone.  March 02, 2010 Acute visit.  Pt states that he was doing better with breathing until finished prednisone taper.  He c/o increased SOB x 2 days.  He also c/o increased cough- non prod.  CT was therefore done and neg.     Apr 29, 2010 follow up, pt states his bretahing is better still have SOB with activity, pt states he was just diagnosed with protstate  cancer and wants to know if that will affect his bretahing any bc of the radiation adn the hormone shots he will be getting.  sleeping good only needs combivent around 5pm, no am exac or sign cough.    July 05, 2010 Acute visit.  Pt c/o wheezing and increased SOB x 4 days.  He gets out of breath just walking approx 200 ft.  Past few days has used combivent 3 times a day- not helping like it used to. no purulent sputum.  September 06, 2010--Presents for follow up. Had some increased wheezing was given steroid taper 3  weeks ago with improvement in breathing.  Has recently been  dx w/ prostate cancer. He is receiving injections and radiation . Says his breathing has returned to baseline. He does wear out easily. Not much energy since starting radiation tx. Denies chest pain,  orthopnea, hemoptysis, fever, n/v/d, edema, headache/ breathing ok, no changes.   Medications Prior to Update: 1)  Coq-10 30 Mg  Caps (Coenzyme Q10) .... Once Daily 2)  [facility] Childrens Complete 60 Mg Chew (Pediatric Multivit-Minerals-C) .Marland Kitchen.. 1 Once Daily 3)  Omeprazole 20 Mg Tbec (Omeprazole) .... Take 1 Tablet By Mouth Once A Day 30 Minutes Before Meal 4)  Metoprolol Tartrate 50 Mg Tabs (Metoprolol Tartrate) .... 1/2 Tab By Mouth Twice Daily 5)  Simvastatin 20 Mg  Tabs (Simvastatin) .... Once Daily 6)  Hydrochlorothiazide 25 Mg Tabs (Hydrochlorothiazide) .... 1/2 Tab By Mouth Once Daily 7)  Benicar 20 Mg  Tabs (Olmesartan Medoxomil) .... 1/2 Once Daily 8)  Symbicort 160-4.5 Mcg/act  Aero (Budesonide-Formoterol Fumarate) .... Two Puffs Each Am and 2 Puffs Each Pm 9)  Adult Aspirin Low Strength 81 Mg  Tbdp (Aspirin) .... Once Daily 10)  Pepcid 20 Mg Tabs (Famotidine) .... One At Bedtime 11)  Vitamin D 1000 Unit Tabs (Cholecalciferol) .Marland Kitchen.. 1 Once Daily 12)  Gabapentin 100 Mg Caps (Gabapentin) .Marland Kitchen.. 1 Once Daily  As Needed 13)  Lexapro 10 Mg Tabs (Escitalopram Oxalate) .... Once Daily 14)  Combivent 18-103 Mcg/act Aero (Ipratropium-Albuterol) .... 2 Puffs As Needed For Short of Breath,  Cough Wheeze Congestion 15)  Mucinex Dm 30-600 Mg Xr12h-Tab (Dextromethorphan-Guaifenesin) .Marland Kitchen.. 1-2 Every 12 Hours As Needed 16)  Acidophilus Probiotic Blend  Caps (Misc Intestinal Flora Regulat) .Marland Kitchen.. 1 Once Daily As Needed 17)  Loratadine 10 Mg Tabs (Loratadine) .... Take 1 Tablet By Mouth Once A Day As Needed Seasonal Allergies 18)  Prednisone 10 Mg  Tabs (Prednisone) .... 4 Each Am X 2days, 2x2days, 1x2days and Stop  Current Medications (verified): 1)   Coq-10 30 Mg  Caps (Coenzyme Q10) .... Once Daily 2)  Centrum Silver  Tabs (Multiple Vitamins-Minerals) .... Take 1 Tablet By Mouth Once A Day 3)  Omeprazole 20 Mg Tbec (Omeprazole) .... Take 1 Tablet By Mouth Once A Day 30 Minutes Before Meal 4)  Metoprolol Tartrate 50 Mg Tabs (Metoprolol Tartrate) .... 1/2 Tab By Mouth Twice Daily 5)  Simvastatin 20 Mg  Tabs (Simvastatin) .... Once Daily 6)  Hydrochlorothiazide 25 Mg Tabs (Hydrochlorothiazide) .... 1/2 Tab By Mouth Once Daily 7)  Benicar 20 Mg  Tabs (Olmesartan Medoxomil) .... 1/2 Once Daily 8)  Symbicort 160-4.5 Mcg/act  Aero (Budesonide-Formoterol Fumarate) .... Two Puffs Each Am and 2 Puffs Each Pm 9)  Adult Aspirin Low Strength 81 Mg  Tbdp (Aspirin) .... Once Daily 10)  Pepcid 20 Mg Tabs (Famotidine) .... One At Bedtime 11)  Vision Vitamins  Tabs (Multiple Vitamins-Minerals) .... Take 1 Tablet By Mouth Once A Day 12)  Vitamin D 1000 Unit Tabs (Cholecalciferol) .Marland Kitchen.. 1 Once Daily 13)  Gabapentin 100 Mg Caps (Gabapentin) .... Take 1 Capsule By Mouth At Bedtime 14)  Tumeric Vitamin 500mg  .... Take 1 Tablet By Mouth Once A Day 15)  Lexapro 10 Mg Tabs (Escitalopram Oxalate) .... Once Daily 16)  Combivent 18-103 Mcg/act Aero (Ipratropium-Albuterol) .... 2 Puffs Every 3 Hours As Needed For Short of Breath,  Cough Wheeze Congestion 17)  Melatonin 1 Mg Caps (Melatonin) .... Take 1 Tab By Mouth At Bedtime As Needed 18)  Mucinex Dm 30-600 Mg Xr12h-Tab (Dextromethorphan-Guaifenesin) .Marland Kitchen.. 1-2 Every 12 Hours As Needed 19)  Acidophilus Probiotic Blend  Caps (Misc Intestinal Flora Regulat) .Marland Kitchen.. 1 Once Daily As Needed 20)  Benicar 20 Mg Tabs (Olmesartan Medoxomil) .... An Extra 1/2 Tab Daily 21)  Gelnique 10 % Gel (Oxybutynin Chloride) .Marland Kitchen.. 1 Pump Once Daily 22)  Loratadine 10 Mg Tabs (Loratadine) .... Take 1 Tablet By Mouth Once A Day As Needed Seasonal Allergies  Allergies (verified): No Known Drug Allergies  Past History:  Past Medical  History: Last updated: 07/05/2010 GERD (ICD-530.81)...................................................................Marland KitchenJarold Motto     - See positive endoscopy report 05/03/2006 showing esophageal stricture and hiatal hernia CAD (ICD-414.00) HYPERTENSION (ICD-401.9) HYPERLIPIDEMIA (ICD-272.4) COUGH, CHRONIC (ICD-786.2)     - Sinus CT neg March 02, 2010  COPD (ICD-496)........................................................................Marland KitchenWert    - PFTs 06/04/08 FEV1 51% ratio 36% diffusing capacity 74% 12% response to bronchodilator    - HFA 75% January 28, 2010 >  February 23, 2010 75% > 100% March 02, 2010 > confirmed July 05, 2010  Complex med regimen    -Meds reviewed with pt education and computerized med calendar completed/ -December 15, 2008, adjusted March 11, 2010      Family History: Last updated: 11/20/2008 Ht dz father, brother Neg resp dz, atopy  Social History: Last updated: 11/03/2009 Quit smoking 1980; pipe smoker x15-15yrs. alcohol -  1 drink daily (Vodka) no caffeine widowed 2 children retired Programme researcher, broadcasting/film/video for [employer]  Risk Factors: Smoking Status: quit (03/18/2008)  Review of Systems      See HPI  Vital Signs:  Patient profile:   77 year old male Height:      69 inches Weight:      175.25 pounds BMI:     25.97 O2 Sat:      94 % on Room air Temp:     97.0 degrees F oral Pulse rate:   78 / minute BP sitting:   140 / 90  (right arm) Cuff size:   regular  Vitals Entered By: Boone Master CNA/MA (September 06, 2010 3:58 PM)  O2 Flow:  Room air CC: follow up  Is Patient Diabetic? No Comments Medications reviewed with patient Daytime contact number verified with patient. Boone Master CNA/MA  September 06, 2010 3:58 PM    Physical Exam  Additional Exam:  GEN: A/Ox3; pleasant , NAD wt 176 > 172 February 23, 2010 > 171 March 02, 2010 >>172 March 11, 2010> 168 Apr 29, 2010 > 169 July 05, 2010 >>175 09/06/10  HEENT:  [place]/AT, , EACs-clear,  TMs-wnl, NOSE-clear, THROAT-clear NECK:  Supple w/ fair ROM; no JVD; normal carotid impulses w/o bruits; no thyromegaly or nodules palpated; no lymphadenopathy. RESP Distant bs, no wheeze  CARD:  RRR, no m/r/g   GI:   Soft & nt; nml bowel sounds; no organomegaly or masses detected. Musco: Warm bil,  no calf tenderness edema, clubbing, pulses intact     Impression & Recommendations:  Problem # 1:  COPD (ICD-496)  compensated on present regimen.  Plan Flu shot today.  Continue on Symbicort 160/4.43mcg 2 puffs two times a day  follow up Dr. Sherene Sires in 2 -3 months  Please contact office for sooner follow up as needed   Orders: Est. Patient Level III (16109)  Medications Added to Medication List This Visit: 1)  Centrum Silver Tabs (Multiple vitamins-minerals) .... Take 1 tablet by mouth once a day 2)  Vision Vitamins Tabs (Multiple vitamins-minerals) .... Take 1 tablet by mouth once a day 3)  Gabapentin 100 Mg Caps (Gabapentin) .... Take 1 capsule by mouth at bedtime 4)  Tumeric Vitamin 500mg   .... Take 1 tablet by mouth once a day 5)  Combivent 18-103 Mcg/act Aero (Ipratropium-albuterol) .... 2 puffs every 3 hours as needed for short of breath,  cough wheeze congestion 6)  Melatonin 1 Mg Caps (Melatonin) .... Take 1 tab by mouth at bedtime as needed 7)  Benicar 20 Mg Tabs (Olmesartan medoxomil) .... An extra 1/2 tab daily 8)  Gelnique 10 % Gel (Oxybutynin chloride) .Marland Kitchen.. 1 pump once daily 9)  Prednisone 10 Mg Tabs (Prednisone) .... 4 tabs for 2 days, then 3 tabs for 2 days, 2 tabs for 2 days, then 1 tab for 2 days, then stop  Complete Medication List: 1)  Coq-10 30 Mg Caps (Coenzyme q10) .... Once daily 2)  Centrum Silver Tabs (Multiple vitamins-minerals) .... Take 1 tablet by mouth once a day 3)  Omeprazole 20 Mg Tbec (Omeprazole) .... Take 1 tablet by mouth once a day 30 minutes before meal 4)  Metoprolol Tartrate 50 Mg Tabs (Metoprolol tartrate) .... 1/2 tab by mouth twice daily 5)   Simvastatin 20 Mg Tabs (Simvastatin) .... Once daily 6)  Hydrochlorothiazide 25 Mg Tabs (Hydrochlorothiazide) .... 1/2 tab by mouth once daily 7)  Benicar 20 Mg Tabs (Olmesartan medoxomil) .... 1/2 once  daily 8)  Symbicort 160-4.5 Mcg/act Aero (Budesonide-formoterol fumarate) .... Two puffs each am and 2 puffs each pm 9)  Adult Aspirin Low Strength 81 Mg Tbdp (Aspirin) .... Once daily 10)  Pepcid 20 Mg Tabs (Famotidine) .... One at bedtime 11)  Vision Vitamins Tabs (Multiple vitamins-minerals) .... Take 1 tablet by mouth once a day 12)  Vitamin D 1000 Unit Tabs (Cholecalciferol) .Marland Kitchen.. 1 once daily 13)  Gabapentin 100 Mg Caps (Gabapentin) .... Take 1 capsule by mouth at bedtime 14)  Tumeric Vitamin 500mg   .... Take 1 tablet by mouth once a day 15)  Lexapro 10 Mg Tabs (Escitalopram oxalate) .... Once daily 16)  Combivent 18-103 Mcg/act Aero (Ipratropium-albuterol) .... 2 puffs every 3 hours as needed for short of breath,  cough wheeze congestion 17)  Melatonin 1 Mg Caps (Melatonin) .... Take 1 tab by mouth at bedtime as needed 18)  Mucinex Dm 30-600 Mg Xr12h-tab (Dextromethorphan-guaifenesin) .Marland Kitchen.. 1-2 every 12 hours as needed 19)  Acidophilus Probiotic Blend Caps (Misc intestinal flora regulat) .Marland Kitchen.. 1 once daily as needed 20)  Benicar 20 Mg Tabs (Olmesartan medoxomil) .... An extra 1/2 tab daily 21)  Gelnique 10 % Gel (Oxybutynin chloride) .Marland Kitchen.. 1 pump once daily 22)  Loratadine 10 Mg Tabs (Loratadine) .... Take 1 tablet by mouth once a day as needed seasonal allergies 23)  Prednisone 10 Mg Tabs (Prednisone) .... 4 tabs for 2 days, then 3 tabs for 2 days, 2 tabs for 2 days, then 1 tab for 2 days, then stop  Other Orders: Flu Vaccine 72yrs + MEDICARE PATIENTS (J1914) Administration Flu vaccine - MCR (N8295)  Patient Instructions: 1)  Flu shot today.  2)  Continue on Symbicort 160/4.86mcg 2 puffs two times a day  3)  follow up Dr. Sherene Sires in 2 -3 months  4)  Please contact office for sooner follow  up as needed  Prescriptions: PREDNISONE 10 MG TABS (PREDNISONE) 4 tabs for 2 days, then 3 tabs for 2 days, 2 tabs for 2 days, then 1 tab for 2 days, then stop  #20 x 0   Entered and Authorized by:   Rubye Oaks NP   Signed by:   Tammy Parrett NP on 09/06/2010   Method used:   Print then Give to Patient   RxID:   [id number]  Flu Vaccine Consent Questions     Do you have a history of severe allergic reactions to this vaccine? no    Any prior history of allergic reactions to egg and/or gelatin? no    Do you have a sensitivity to the preservative Thimersol? no    Do you have a past history of Guillan-Barre Syndrome? no    Do you currently have an acute febrile illness? no    Have you ever had a severe reaction to latex? no    Vaccine information given and explained to patient? yes    Are you currently pregnant? no    Lot Number:[id number]   Exp Date:06/11/2011   Site Given  Left Deltoid Lanny Cramp CMA  September 06, 2010 4:34 PM     .lbmedflu

## 2011-01-13 NOTE — Progress Notes (Signed)
Summary: Pred taper called in - OV declined  Phone Note Other Incoming   Caller: Dr. Lanora Manis Deterding Summary of Call: Jules-director-received email from Dr. Darrick Penna on Friday, Sept 9, 2011 stating she "received phone call from [facility] Liver patient of Dr. Sherene Sires, DOB Jan 08, 1923.  Patient states doing well until today when he had increased wheezing.  Able to talk in complete sentences.  Audible soft wheezes heard on end exp.  No fevers, chills, cough.  No sick contacts.  Using symbicort and rescue inhaler ( at least 4 times).  States that Dr. Sherene Sires had treated similar episode with pred taper.  Call script for pred 10mg  tablets 4 tablets x 2 days, then 2 x 2 days, then 1 x 2 days then d/c to [facility] 24 hour pharmacy at [phone].  Stressed for pt to come to ED if unable to talk in complete sentences/catch breath/worsening sxs.  He was also instructed to call office Monday am for follow-up."    Per Budd Palmer, pls make sure proper follow up has been done.  No pending appts at this time.    Called pt to schedule ROV however pt declined stating it is "not necessary" at this time bc sxs have improved.  He also states he is taking radiation everyday and it is difficult to make other appts at this time.  Will forward message to MW so he is aware.   Initial call taken by: Gweneth Dimitri RN,  August 23, 2010 5:08 PM  Follow-up for Phone Call        call to make sure better and ov with all meds in hand w/in next 2 weeks to regroup in terms of maint rx either me or tammy NP Follow-up by: Nyoka Cowden MD,  August 23, 2010 5:17 PM  Additional Follow-up for Phone Call Additional follow up Details #1::        Spoke with pt and advised that he needs ov for followup in 2 wks. Pt states doing better.  Sched appt for 09/06/10 at 3:45 pm.  Aware to bring all meds. Additional Follow-up by: Vernie Murders,  August 23, 2010 5:26 PM

## 2011-01-13 NOTE — Assessment & Plan Note (Signed)
Summary: Pulmonary/ ext ov with hfa teaching 100% effective   Primary Provider/Referring Provider:  Jacky Kindle MD, [facility]  CC:  Acute visit.  Pt states that he was doing better with breathing until finished prednisone taper.  He c/o increased SOB x 2 days.  He also c/o increased cough- non prod.  Marland Kitchen  History of Present Illness:  77-yowm quit smoking age 77  with chronic cough and evidence of reversible air flow obstruction by PFTs who did not improve on Spiriva, but seemed much better in terms of dyspnea on Advair, but worse in terms of hoarseness.    Chart review did indicate that he had previously undergone upper endoscopy which showed indeed he does have a large hiatal hernia with esophageal stricture and GERD and Dr. Jarold Motto recommended that he maintain on Nexium beforeakfast daily,  which he had not been doing consistently. He was asked to take PPI perfectly regularly and switched from Advair to Symbicort and returned 06/04/08  stating that his breathing was the best it's been in years and his hoarseness is also much better.  January 28, 2010 Followup.  Pt states that his breathing has been okay.  He c/o chest congestion at bedtime and cough with clear sputum.  He also states that he tries to walk every day and notices some wheezing sometimes.  Ex tol baseline, main concern is how to handle the next exacerbation. no changes in rx   February 23, 2010 Acute visit.  Pt c/o prod cough with white to yellow sputum x 4 days.  He states that when he breaths his chest "rattles".  He had rx for amocicillin 500 mg and started taking this bid for the past 2 days. rec change to combivent and 6 days prednisone.  March 02, 2010 Acute visit.  Pt states that he was doing better with breathing until finished prednisone taper.  He c/o increased SOB x 2 days.  He also c/o increased cough- non prod.  CT was therefore done and neg.  Pt denies any significant sore throat, dysphagia, itching, sneezing,  nasal  congestion or excess secretions,  fever, chills, sweats, unintended wt loss, pleuritic or exertional cp, hempoptysis, change in activity tolerance  orthopnea pnd or leg swelling Pt also denies any obvious fluctuation in symptoms with weather or environmental change or other alleviating or aggravating factors.         Current Medications (verified): 1)  Coq-10 30 Mg  Caps (Coenzyme Q10) .... Once Daily 2)  Centrum Silver  Tabs (Multiple Vitamins-Minerals) .... Take 1 Tablet By Mouth Once A Day 3)  Omeprazole 20 Mg Tbec (Omeprazole) .... Take 1 Tablet By Mouth Once A Day 30 Minutes Before Meal 4)  Metoprolol Tartrate 50 Mg Tabs (Metoprolol Tartrate) .... 1/2 Tab By Mouth Twice Daily 5)  Mens Prostate Health Formula   Caps (Misc Natural Products) .... Once Daily 6)  Simvastatin 20 Mg  Tabs (Simvastatin) .... Once Daily 7)  Hydrochlorothiazide 25 Mg Tabs (Hydrochlorothiazide) .... 1/2 Tab By Mouth Once Daily 8)  Benicar 20 Mg  Tabs (Olmesartan Medoxomil) .... 1/2 Once Daily 9)  Symbicort 160-4.5 Mcg/act  Aero (Budesonide-Formoterol Fumarate) .... Two Puffs Each Am and 2 Puffs Each Pm 10)  Adult Aspirin Low Strength 81 Mg  Tbdp (Aspirin) .... Once Daily 11)  Acidophilus Probiotic Blend  Caps (Misc Intestinal Flora Regulat) .Marland Kitchen.. 1 Once Daily As Needed 12)  Mucinex Dm 30-600 Mg Xr12h-Tab (Dextromethorphan-Guaifenesin) .Marland Kitchen.. 1-2 Every 12 Hours As Needed 13)  Loratadine 10  Mg Tabs (Loratadine) .... Take 1 Tablet By Mouth Once A Day As Needed Seasonal Allergies 14)  Pepcid 20 Mg Tabs (Famotidine) .... One At Bedtime 15)  Gabapentin 100 Mg Caps (Gabapentin) .Marland Kitchen.. 1 Once Daily As Needed 16)  Vitamin D 1000 Unit Tabs (Cholecalciferol) .Marland Kitchen.. 1 Once Daily 17)  Combivent 18-103 Mcg/act Aero (Ipratropium-Albuterol) .... 2 Puffs As Needed For Short of Breath,  Cough Wheeze Congestion  Allergies (verified): No Known Drug Allergies  Past History:  Past Medical History: GERD  (ICD-530.81)...................................................................Marland KitchenJarold Motto     - See positive endoscopy report 05/03/2006 showing esophageal stricture and hiatal hernia CAD (ICD-414.00) HYPERTENSION (ICD-401.9) HYPERLIPIDEMIA (ICD-272.4) COUGH, CHRONIC (ICD-786.2)     - Sinus CT neg March 02, 2010  COPD (ICD-496).......................................................................Marland KitchenWert    - PFTs 06/04/08 FEV1 51% ratio 36% diffusing capacity 74% 12% response to bronchodilator    - HFA 75% January 28, 2010 >  February 23, 2010 75% > 100% March 02, 2010  Complex med regimen    -Meds reviewed with pt education and computerized med calendar completed/adjusted. -December 15, 2008     Vital Signs:  Patient profile:   77 year old male Weight:      171.50 pounds O2 Sat:      99 % on Room air Temp:     97.3 degrees F oral Pulse rate:   92 / minute BP sitting:   130 / 80  (left arm)  Vitals Entered By: Vernie Murders (March 02, 2010 9:31 AM)  O2 Flow:  Room air  Physical Exam  Additional Exam:  GEN: A/Ox3; pleasant , NAD wt 176 > 172 February 23, 2010 > 171 March 02, 2010  HEENT:  [place]/AT, , EACs-clear, TMs-wnl, NOSE-clear, THROAT-clear NECK:  Supple w/ fair ROM; no JVD; normal carotid impulses w/o bruits; no thyromegaly or nodules palpated; no lymphadenopathy. RESP  Distant bs with mid bilateral  exp rhonchi minimal CARD:  RRR, no m/r/g   GI:   Soft & nt; nml bowel sounds; no organomegaly or masses detected. Musco: Warm bil,  no calf tenderness edema, clubbing, pulses intact     Impression & Recommendations:  Problem # 1:  COUGH, CHRONIC (ICD-786.2)  The most common causes of chronic cough in immunocompetent adults include: upper airway cough syndrome (UACS), previously referred to as postnasal drip syndrome,  caused by variety of rhinosinus conditions; (2) asthma; (3) GERD; (4) chronic bronchitis from cigarette smoking or other inhaled environmental irritants; (5)  nonasthmatic eosinophilic bronchitis; and (6) bronchiectasis. These conditions, singly or in combination, have accounted for up to 94% of the causes of chronic cough in prospective studies.   He does appear to be steroid responsive so ddx is eosinophilic bronchitis, rhinitis, cough variant asthma     Problem # 2:  GERD (ICD-530.81)  His updated medication list for this problem includes:    Omeprazole 20 Mg Tbec (Omeprazole) .Marland Kitchen... Take 1 tablet by mouth once a day 30 minutes before meal    Pepcid 20 Mg Tabs (Famotidine) ..... One at bedtime  Orders: Est. Patient Level IV (16[address])  Problem # 3:  COPD (ICD-496) I spent extra time with the patient today explaining optimal mdi  technique.  This improved to 100%.   DDX of  difficult airways managment all start with A and  include  Adherence, Ace Inhibitors, Acid Reflux, Active Sinus Disease, Alpha 1 Antitripsin deficiency, Anxiety masquerading as Airways dz,  ABPA,  allergy(esp in young), Aspiration (esp in elderly), Adverse effects of DPI,  Active smokers, plus one B  = Beta blocker use..    May need to consider change to bisoprolol on f/u ov   Each maintenance medication was reviewed in detail including most importantly the difference between maintenance and as needed and under what circumstances the prns are to be used. See instructions for specific recommendations   Medications Added to Medication List This Visit: 1)  Prednisone 10 Mg Tabs (Prednisone) .... 4 each am x 2 days,  3 x 2days, 2x2 days, and 1x2 days  Other Orders: Misc. Referral (Misc. Ref) HFA Instruction [phone])  Patient Instructions: 1)  See Patient Care Coordinator before leaving for Sinus CT 2)  Keep appt with Tammy 3)  Prednisone 10 mg 4 each am x 2 days,  3 x 2days, 2x2 days, and 1x2 days  4)  Keep using the combivent up to 2 puffs every 4 hours  5)  GERD (REFLUX)  is a common cause of respiratory symptoms. It commonly presents without heartburn and can be treated  with medication, but also with lifestyle changes including avoidance of late meals, excessive alcohol, smoking cessation, and avoid fatty foods, chocolate, peppermint, colas, red wine, and acidic juices such as orange juice. NO MINT OR MENTHOL PRODUCTS SO NO COUGH DROPS  6)  USE SUGARLESS CANDY INSTEAD (jolley ranchers)  7)  NO OIL BASED VITAMINS  Prescriptions: PREDNISONE 10 MG  TABS (PREDNISONE) 4 each am x 2 days,  3 x 2days, 2x2 days, and 1x2 days  #20 x 0   Entered and Authorized by:   Nyoka Cowden MD   Signed by:   Nyoka Cowden MD on 03/02/2010   Method used:   Electronically to        [facility]* (retail)       [address]       [place], [place]  [id number]       Ph: [phone]       Fax: [phone]   RxID:   [id number]

## 2011-01-13 NOTE — Assessment & Plan Note (Signed)
Summary: Pulmonary/ f/u summary ov, pulmonary f/u can be prn   Primary Provider/Referring Provider:  Jacky Kindle MD, [facility]  CC:  follow up, pt states his bretahing is better still have SOB with activity, and pt states he was just diagnosed with protstat cancer adn wants to know if that will affect his bretahing any bc of the radiation adn the hormone shots he will be getting.  History of Present Illness:  86-yowm quit smoking age 77  with chronic cough and evidence of reversible air flow obstruction by PFTs who did not improve on Spiriva, but seemed much better in terms of dyspnea on Advair, but worse in terms of hoarseness.    Chart review did indicate that he had egd with  large hiatal hernia with esophageal stricture and GERD and Dr. Jarold Motto recommended that he maintain on Nexium beforeakfast daily,  which he had not been doing consistently so rec PPI perfectly regularly and switched from Advair to Symbicort and returned 06/04/08  stating that his breathing was the best it's been in years and his hoarseness is also much better.  February 23, 2010 Acute visit.  Pt c/o prod cough with white to yellow sputum x 4 days.  He states that when he breaths his chest "rattles".  He had rx for amocicillin 500 mg and started taking this bid for the past 2 days. rec change to combivent and 6 days prednisone.  March 02, 2010 Acute visit.  Pt states that he was doing better with breathing until finished prednisone taper.  He c/o increased SOB x 2 days.  He also c/o increased cough- non prod.  CT was therefore done and neg.     Apr 29, 2010 follow up, pt states his bretahing is better still have SOB with activity, pt states he was just diagnosed with protstate  cancer and wants to know if that will affect his bretahing any bc of the radiation adn the hormone shots he will be getting.  sleeping good only needs combivent around 5pm, no am exac or sign cough.  Pt denies any significant sore throat, dysphagia,  itching, sneezing,  nasal congestion or excess secretions,  fever, chills, sweats, unintended wt loss, pleuritic or exertional cp, hempoptysis, change in activity tolerance  orthopnea pnd or leg swelling   Current Medications (verified): 1)  Coq-10 30 Mg  Caps (Coenzyme Q10) .... Once Daily 2)  Centrum Silver  Tabs (Multiple Vitamins-Minerals) .... Take 1 Tablet By Mouth Once A Day 3)  Omeprazole 20 Mg Tbec (Omeprazole) .... Take 1 Tablet By Mouth Once A Day 30 Minutes Before Meal 4)  Metoprolol Tartrate 50 Mg Tabs (Metoprolol Tartrate) .... 1/2 Tab By Mouth Twice Daily 5)  Simvastatin 20 Mg  Tabs (Simvastatin) .... Once Daily 6)  Hydrochlorothiazide 25 Mg Tabs (Hydrochlorothiazide) .... 1/2 Tab By Mouth Once Daily 7)  Benicar 20 Mg  Tabs (Olmesartan Medoxomil) .... 1/2 Once Daily 8)  Symbicort 160-4.5 Mcg/act  Aero (Budesonide-Formoterol Fumarate) .... Two Puffs Each Am and 2 Puffs Each Pm 9)  Adult Aspirin Low Strength 81 Mg  Tbdp (Aspirin) .... Once Daily 10)  Acidophilus Probiotic Blend  Caps (Misc Intestinal Flora Regulat) .Marland Kitchen.. 1 Once Daily As Needed 11)  Mucinex Dm 30-600 Mg Xr12h-Tab (Dextromethorphan-Guaifenesin) .Marland Kitchen.. 1-2 Every 12 Hours As Needed 12)  Loratadine 10 Mg Tabs (Loratadine) .... Take 1 Tablet By Mouth Once A Day As Needed Seasonal Allergies 13)  Pepcid 20 Mg Tabs (Famotidine) .... One At Bedtime 14)  Gabapentin 100  Mg Caps (Gabapentin) .Marland Kitchen.. 1 Once Daily As Needed 15)  Vitamin D 1000 Unit Tabs (Cholecalciferol) .Marland Kitchen.. 1 Once Daily 16)  Combivent 18-103 Mcg/act Aero (Ipratropium-Albuterol) .... 2 Puffs As Needed For Short of Breath,  Cough Wheeze Congestion 17)  Lexapro 10 Mg Tabs (Escitalopram Oxalate) .... Once Daily  Allergies (verified): No Known Drug Allergies  Past History:  Past Medical History: GERD (ICD-530.81)...................................................................Marland KitchenJarold Motto     - See positive endoscopy report 05/03/2006 showing esophageal stricture and  hiatal hernia CAD (ICD-414.00) HYPERTENSION (ICD-401.9) HYPERLIPIDEMIA (ICD-272.4) COUGH, CHRONIC (ICD-786.2)     - Sinus CT neg March 02, 2010  COPD (ICD-496)........................................................................Marland KitchenWert    - PFTs 06/04/08 FEV1 51% ratio 36% diffusing capacity 74% 12% response to bronchodilator    - HFA 75% January 28, 2010 >  February 23, 2010 75% > 100% March 02, 2010  Complex med regimen    -Meds reviewed with pt education and computerized med calendar completed/ -December 15, 2008, adjusted March 11, 2010      Vital Signs:  Patient profile:   77 year old male Height:      69 inches Weight:      168 pounds BMI:     24.90 O2 Sat:      94 % on Room air Temp:     97.3 degrees F oral Pulse rate:   65 / minute BP sitting:   112 / 78  (right arm) Cuff size:   regular  Vitals Entered By: Vernie Murders (Apr 29, 2010 11:50 AM)  O2 Flow:  Room air CC: follow up, pt states his bretahing is better still have SOB with activity, pt states he was just diagnosed with protstat cancer adn wants to know if that will affect his bretahing any bc of the radiation adn the hormone shots he will be getting Comments Meds and allergies updated Vernie Murders  Apr 29, 2010 11:52 AM    Physical Exam  Additional Exam:  GEN: A/Ox3; pleasant , NAD wt 176 > 172 February 23, 2010 > 171 March 02, 2010 >>172 March 11, 2010> 1632 Apr 29, 2010  HEENT:  [place]/AT, , EACs-clear, TMs-wnl, NOSE-clear, THROAT-clear NECK:  Supple w/ fair ROM; no JVD; normal carotid impulses w/o bruits; no thyromegaly or nodules palpated; no lymphadenopathy. RESP Distant bs, no wheeze, mild increase T exp  CARD:  RRR, no m/r/g   GI:   Soft & nt; nml bowel sounds; no organomegaly or masses detected. Musco: Warm bil,  no calf tenderness edema, clubbing, pulses intact     Impression & Recommendations:  Problem # 1:  COPD (ICD-496) GOLD III with sign reversible component   Each maintenance medication was  reviewed in detail including most importantly the difference between maintenance and as needed and under what circumstances the prns are to be used. This was done in the context of a medication calendar review which provided the patient with a user-friendly unambiguous mechanism for medication administration and reconciliation and provides an action plan for all active problems. It is critical that this be shown to every doctor  for modification during the office visit if necessary so the patient can use it as a working document.   Medications Added to Medication List This Visit: 1)  Lexapro 10 Mg Tabs (Escitalopram oxalate) .... Once daily  Patient Instructions: 1)  See calendar for specific medication instructions and bring it back for each and every office visit for every healthcare provider you see.  Without it,  you may not receive  the best quality medical care that we feel you deserve.  2)  If breathing worsening and or your combivent need rises, we need to see you right away. otherwise follow up is as needed  Appended Document: Orders Update     Clinical Lists Changes  Orders: Added new Service order of Est. Patient Level III (78469) - Signed

## 2011-01-13 NOTE — Progress Notes (Signed)
Summary: prescription to have on hand > zpack x 1  Phone Note Call from Patient Call back at [facility] Phone [phone]   Caller: Patient Call For: wert Summary of Call: Requests rx for z-pak.//[facility] Initial call taken by: Darletta Moll,  November 23, 2010 1:02 PM  Follow-up for Phone Call        Spoke with pt.  He states that he will be traveling out of town for Christmas and is requesting rx for zpack to have on hand in case he gets sick. He states that he is currently doing well and denies any complaints.  I advised MW out of the office until tommorrow am and he is fine with waiting until then for answer. Pls advise thanks! *He does have appt pending with MW in Jan 2012  Follow-up by: Vernie Murders,  November 23, 2010 2:00 PM  Additional Follow-up for Phone Call Additional follow up Details #1::        ok x 1 zpack Additional Follow-up by: Nyoka Cowden MD,  November 23, 2010 3:41 PM    New/Updated Medications: ZITHROMAX Z-PAK 250 MG TABS (AZITHROMYCIN) take as directed Prescriptions: ZITHROMAX Z-PAK 250 MG TABS (AZITHROMYCIN) take as directed  #1 x 0   Entered by:   Vernie Murders   Authorized by:   Nyoka Cowden MD   Signed by:   Vernie Murders on 11/23/2010   Method used:   Electronically to        [facility]* (retail)       [address]       [place], [place]  [id number]       Ph: [phone]       Fax: [phone]   RxID:   [id number]

## 2011-01-13 NOTE — Progress Notes (Signed)
Summary: speak to nurse  Phone Note Call from Patient Call back at Home Phone [phone]   Caller: Patient Call For: wert Summary of Call: Chest cold, congestion, would like to be seen please advise. Initial call taken by: Darletta Moll,  February 22, 2010 9:11 AM  Follow-up for Phone Call        Called and spoke with pt.  He c/o increased cough/chest congestion.  OV with MW sched for tommorrow at 1:30 pm.  ER sooner if worsens. Follow-up by: Vernie Murders,  February 22, 2010 9:32 AM

## 2011-01-13 NOTE — Progress Notes (Signed)
Summary: appt changed from TP to MW  Phone Note Outgoing Call   Call placed by: Boone Master CNA,  January 27, 2010 12:59 PM Call placed to: Patient Summary of Call: ATC pt at home number.  LMOM TCB.  received a refill request from pharmacy for refills on pt's symbicort.  pt has not seen MW since 02/2009 and with last refills was advised needs OV w/ MW for further refills.  pt had appt w/ TP 01-28-10 @ 1130.  I have changed this appt to MW's schedule at 1045.  will await pt's call-back to inform his of this. Initial call taken by: Boone Master CNA,  January 27, 2010 1:01 PM  Follow-up for Phone Call        pt returned call.  advised of appt switch due to meds refills.  pt was okay with the switch and verbalized his understanding.  will send refills of symbicort to pt's pharmacy. Boone Master CNA  January 27, 2010 2:16 PM

## 2011-01-13 NOTE — Assessment & Plan Note (Signed)
Summary: Pulmonary/ acute ov with hfa teaching 75%   Primary Provider/Referring Provider:  Jacky Kindle MD, [facility]  CC:  Acute visit.  Pt c/o prod cough with white to yellow sputum x 4 days.  He states that when he breaths his chest "rattles".  He had rx for amocicillin 500 mg and started taking this bid for the past 2 days.Cristian [name]  History of Present Illness:  86-yowm quit smoking age 77  with chronic cough and evidence of reversible air flow obstruction by PFTs who did not improve on Spiriva, but seemed much better in terms of dyspnea on Advair, but worse in terms of hoarseness.    Chart review did indicate that he had previously undergone upper endoscopy which showed indeed he does have a large hiatal hernia with esophageal stricture and GERD and Dr. Jarold Motto recommended that he maintain on Nexium beforeakfast daily,  which he had not been doing consistently. He was asked to take PPI perfectly regularly and switched from Advair to Symbicort and returned 06/04/08  stating that his breathing was the best it's been in years and his hoarseness is also much better.  January 28, 2010 Followup.  Pt states that his breathing has been okay.  He c/o chest congestion at bedtime and cough with clear sputum.  He also states that he tries to walk every day and notices some wheezing sometimes.  Ex tol baseline, main concern is how to handle the next exacerbation.   February 23, 2010 Acute visit.  Pt c/o prod cough with white to yellow sputum x 4 days.  He states that when he breaths his chest "rattles".  He had rx for amocicillin 500 mg and started taking this bid for the past 2 days.  Pt denies any significant sore throat, dysphagia, itching, sneezing,  nasal congestion or excess nasal secretions,  fever, chills, sweats, unintended wt loss, pleuritic or exertional cp, hempoptysis, change in activity tolerance  orthopnea pnd or leg swelling.  Pt also denies any obvious fluctuation in symptoms with weather or  environmental change or other alleviating or aggravating factors.        Current Medications (verified): 1)  Coq-10 30 Mg  Caps (Coenzyme Q10) .... Once Daily 2)  Centrum Silver  Tabs (Multiple Vitamins-Minerals) .... Take 1 Tablet By Mouth Once A Day 3)  Omeprazole 20 Mg Tbec (Omeprazole) .... Take 1 Tablet By Mouth Once A Day 30 Minutes Before Meal 4)  Metoprolol Tartrate 50 Mg Tabs (Metoprolol Tartrate) .... 1/2 Tab By Mouth Twice Daily 5)  Mens Prostate Health Formula   Caps (Misc Natural Products) .... Once Daily 6)  Simvastatin 20 Mg  Tabs (Simvastatin) .... Once Daily 7)  Hydrochlorothiazide 25 Mg Tabs (Hydrochlorothiazide) .... 1/2 Tab By Mouth Once Daily 8)  Benicar 20 Mg  Tabs (Olmesartan Medoxomil) .... 1/2 Once Daily 9)  Symbicort 160-4.5 Mcg/act  Aero (Budesonide-Formoterol Fumarate) .... Two Puffs Each Am and 2 Puffs Each Pm 10)  Adult Aspirin Low Strength 81 Mg  Tbdp (Aspirin) .... Once Daily 11)  Acidophilus Probiotic Blend  Caps (Misc Intestinal Flora Regulat) .Cristian [name].. 1 Once Daily As Needed 12)  Proventil Hfa 108 (90 Base) Mcg/act  Aers (Albuterol Sulfate) .Cristian [name].. 1-2 Puffs Every 4-6 Hours As Needed For Breathing Difficulty (Rescue Inhaler) 13)  Mucinex Dm 30-600 Mg Xr12h-Tab (Dextromethorphan-Guaifenesin) .Cristian [name].. 1-2 Every 12 Hours As Needed 14)  Loratadine 10 Mg Tabs (Loratadine) .... Take 1 Tablet By Mouth Once A Day As Needed Seasonal Allergies 15)  Aerochamber .... Use  Symbicort Daily 16)  Pepcid 20 Mg Tabs (Famotidine) .... One At Bedtime 17)  Gabapentin 100 Mg Caps (Gabapentin) .Cristian [name].. 1 Once Daily As Needed 18)  Vitamin D 1000 Unit Tabs (Cholecalciferol) .Cristian [name].. 1 Once Daily  Allergies (verified): No Known Drug Allergies  Past History:  Past Medical History: GERD (ICD-530.81)...................................................................Cristian KitchenJarold Motto     - See positive endoscopy report 05/03/2006 showing esophageal stricture and hiatal hernia CAD (ICD-414.00) HYPERTENSION  (ICD-401.9) HYPERLIPIDEMIA (ICD-272.4) COUGH, CHRONIC (ICD-786.2) COPD (ICD-496).......................................................................Cristian KitchenWert    - PFTs 06/04/08 FEV1 51% ratio 36% diffusing capacity 74% 12% response to bronchodilator    - HFA 75% January 28, 2010 >  February 23, 2010 75% Complex med regimen    -Meds reviewed with pt education and computerized med calendar completed/adjusted. -December 15, 2008     Vital Signs:  Patient profile:   77 year old male Weight:      172 pounds O2 Sat:      99 % on Room air Temp:     97.3 degrees F oral Pulse rate:   87 / minute BP sitting:   134 / 96  (left arm)  Vitals Entered By: Vernie Murders (February 23, 2010 1:39 PM)  O2 Flow:  Room air  Physical Exam  Additional Exam:  GEN: A/Ox3; pleasant , NAD wt 176 > 172 February 23, 2010  HEENT:  [place]/AT, , EACs-clear, TMs-wnl, NOSE-clear, THROAT-clear NECK:  Supple w/ fair ROM; no JVD; normal carotid impulses w/o bruits; no thyromegaly or nodules palpated; no lymphadenopathy. RESP  Distant bs with mid bilateral  exp rhonchi  CARD:  RRR, no m/r/g   GI:   Soft & nt; nml bowel sounds; no organomegaly or masses detected. [place]co: Warm bil,  no calf tenderness edema, clubbing, pulses intact     Impression & Recommendations:  Problem # 1:  CHRONIC OBSTRUCTIVE PULMONARY DISEASE, ACUTE EXACERBATION (ICD-491.21) GOLD II/III with sign ab component  I spent extra time with the patient today explaining optimal mdi  technique.  This improved from  50-75%.   Each maintenance medication was reviewed in detail including most importantly the difference between maintenance and as needed and under what circumstances the prns are to be used. Pt struggling with concept of med reconciliation.   To keep things simple, I have asked the patient to first separate medicines that are perceived as maintenance, that is to be taken daily "no matter what", from those medicines that are taken on only on an  as-needed basis and I have given the patient examples of both, and then return to see our NP to generate a  detailed  medication calendar which should be followed until the next physician sees the patient and updates it.   Once we're sure that we're all reading from the same page in terms of medication admiistration, she needs to be scheduled to follow up with me.  Medications Added to Medication List This Visit: 1)  Gabapentin 100 Mg Caps (Gabapentin) .Cristian [name].. 1 once daily as needed 2)  Vitamin D 1000 Unit Tabs (Cholecalciferol) .Cristian [name].. 1 once daily 3)  Combivent 18-103 Mcg/act Aero (Ipratropium-albuterol) .... 2 puffs as needed for short of breath,  cough wheeze congestion 4)  Prednisone 10 Mg Tabs (Prednisone) .... 4 each am x 2days, 2x2days, 1x2days and stop  Other Orders: Est. Patient Level III (16109) HFA Instruction [phone])  Patient Instructions: 1)  stop ventolin and start combivent 2 puffs every 4 hours as needed 2)  prednisone 4 each am x 2days, 2x2days, 1x2days  and stop  3)  See Tammy NP w/in 2 weeks with all your medications, even over the counter meds, separated in two separate bags, the ones you take no matter what vs the ones you stop once you feel better and take only as needed.  She will generate for you a new user friendly medication calendar that will put [place] all on the same page re: your medication use.  Prescriptions: PREDNISONE 10 MG  TABS (PREDNISONE) 4 each am x 2days, 2x2days, 1x2days and stop  #14 x 0   Entered and Authorized by:   Nyoka Cowden MD   Signed by:   Nyoka Cowden MD on 02/23/2010   Method used:   Electronically to        [facility]* (retail)       [address]       [place], [place]  [id number]       Ph: [phone]       Fax: [phone]   RxID:   [id number] COMBIVENT 18-103 MCG/ACT AERO (IPRATROPIUM-ALBUTEROL) 2 puffs as needed for short of breath,  cough wheeze congestion  #1 x 11   Entered and Authorized by:   Nyoka Cowden  MD   Signed by:   Nyoka Cowden MD on 02/23/2010   Method used:   Electronically to        [place] [facility]* (retail)       [address]       [place], [place]  [id number]       Ph: [phone]       Fax: [phone]   RxID:   [id number]

## 2011-01-13 NOTE — Progress Notes (Signed)
Summary: pt still sick  Phone Note Call from Patient Call back at Home Phone [phone]   Caller: Patient Call For: wert Summary of Call: pt saw dr wert 3/15. states that he is still coughing/ SOB.  Initial call taken by: Tivis Ringer, CNA,  March 01, 2010 4:19 PM  Follow-up for Phone Call        Appt Scheduled with MW at 9:30 am tommorrow. Follow-up by: Vernie Murders,  March 01, 2010 4:22 PM

## 2011-01-13 NOTE — Progress Notes (Signed)
Summary: SOB, cough- appt sched  Phone Note Call from Patient Call back at Home Phone [phone]   Caller: Patient Call For: Cristian [name] Reason for Call: Talk to Nurse Summary of Call: Patient calling w/ c/o cough with yellow sputum, sob, sweats.  Has started z-pack yesterday, not feeling any better.  Requesting appt. w/ TP. Initial call taken by: Lehman Prom,  October 26, 2010 8:14 AM  Follow-up for Phone Call        Called and spoke with pt.  He c/o prod cough with yellow sputum, increased SOB and "feeling hot" x 3 days.  He had old rx for zpack and started this yesterday and states that he feels worse. OV with TP sched for 10:45 am. Follow-up by: Vernie Murders,  October 26, 2010 9:45 AM

## 2011-01-13 NOTE — Progress Notes (Signed)
Summary: rx req/  prednisone  Phone Note Call from Patient Call back at Home Phone [phone]   Caller: Patient Call For: tammy parrett Summary of Call: pt c/o "flareup/ COPD". wants prednisone- [facility]. please call pt to confirm Initial call taken by: Tivis Ringer, CNA,  January 22, 2010 12:15 PM  Follow-up for Phone Call        Spoke with pt.  He c/o wheezing and increaed SOB.  Denies fever or cough.  Has ov with TP sched for next wk but would like pred taper called in in the meantime.  Please advise thanks Vernie Murders  January 22, 2010 12:29 PM   Additional Follow-up for Phone Call Additional follow up Details #1::        prednisone taper 10mg  4 tabs for 2 days, then 3 tabs for 2 days, 2 tabs for 2 days, then 1 tab for 2 days, then stop    #20 no refills Please contact office for sooner follow up if symptoms do not improve or worsen  follow up as scheduled and as needed   Additional Follow-up by: Rubye Oaks NP,  January 22, 2010 12:36 PM    Additional Follow-up for Phone Call Additional follow up Details #2::    Rx for prednisone taper called in.  Spoke with pt and made aware that rx was sent to pharm and advised to keep appt sched for next wk and to call sooner if symptoms worsen. Follow-up by: Vernie Murders,  January 22, 2010 12:41 PM  New/Updated Medications: PREDNISONE 10 MG TABS (PREDNISONE) take 4 each x 2 days, 3 each am x 2 days, 2 each am x 2 days, and then 1 each am x 2 days and stop Prescriptions: PREDNISONE 10 MG TABS (PREDNISONE) take 4 each x 2 days, 3 each am x 2 days, 2 each am x 2 days, and then 1 each am x 2 days and stop  #20 x 0   Entered by:   Vernie Murders   Authorized by:   Rubye Oaks NP   Signed by:   Vernie Murders on 01/22/2010   Method used:   Electronically to        [facility]* (retail)       [address]       [place], [place]  [id number]       Ph: [phone]       Fax: [phone]   RxID:    [id number]

## 2011-01-13 NOTE — Progress Notes (Signed)
Summary: rx for prn prednisone  Phone Note Call from Patient Call back at Home Phone [phone]   Caller: Patient Call For: Tranquilino Fischler Summary of Call: pt was seen earlier today. says he was to have rx of prednisone called in to [facility].  Initial call taken by: Tivis Ringer, CNA,  January 28, 2010 2:16 PM  Follow-up for Phone Call        dr Yadhira Mckneely pls advise if pt needs pred rx and if so directions  Philipp Deputy [facility]  January 28, 2010 2:19 PM  prednisone 10 mg 4 each am x 2days, 2x2days, 1x2days and stop  #14 no refils Follow-up by: Nyoka Cowden MD,  January 28, 2010 3:22 PM  Additional Follow-up for Phone Call Additional follow up Details #1::        rx sent. pt aware. Carron Curie CMA  January 28, 2010 3:26 PM     New/Updated Medications: PREDNISONE 10 MG TABS (PREDNISONE) 4 each am x 2days, 2x2days, 1x2days and stop Prescriptions: PREDNISONE 10 MG TABS (PREDNISONE) 4 each am x 2days, 2x2days, 1x2days and stop  #14 x 0   Entered by:   Carron Curie CMA   Authorized by:   Nyoka Cowden MD   Signed by:   Carron Curie CMA on 01/28/2010   Method used:   Electronically to        [facility]* (retail)       [address]       [place], [place]  [id number]       Ph: [phone]       Fax: [phone]   RxID:   [id number]

## 2011-01-13 NOTE — Progress Notes (Signed)
  Phone Note Call from Patient   Caller: Patient Reason for Call: Acute Illness Summary of Call: Called answering service on 3/13 afternoon for c/o worsening cough and sputum production. "feel like I am getting a cold".   REquesting antibiotic be called in to pharmacy.  Pt denies chest pain, n/v/d.  Discussed with patient that if he feels poorly he needs to report to emergency room or urgent care.  IF not, he should call office in am for acute visit.   Initial call taken by: Canary Brim NP-C,  February 21, 2010 1720

## 2011-01-13 NOTE — Progress Notes (Signed)
Summary: sore on tongue  Phone Note Call from Patient   Caller: Patient Call For: wert Summary of Call: sore on tongue think inhaler may have caused it. Initial call taken by: Rickard Patience,  September 22, 2010 10:10 AM  Follow-up for Phone Call        PT CALLED BACK SINCE CALL WITH MARGARET WAS DISCONNECTED. THIS BEGan LAST NIGHT. MAY BE YEAST INFECTION FROM USING SYMBICORT. PT HAS USED THIS FOR 2 YRS AND HAS NEVER HAD THIS BEFORE. DENIES FEVER. NO OTHER COMPLAINTS. [facility]. Tivis Ringer, CNA  September 22, 2010 10:18 AM  Pt c/o a swollen spot on his tongue that is black in color. He denies any other spots in his mouth. He states the spot does not hurt. I advised this did not sound like thrush from an inhaler and that he should call his PCP. He states his PCP was in a MVA and is not available at this time and request message be sent to MW. I advised I will forward message. Carron Curie CMA  September 22, 2010 11:33 AM will need to see whoever's covering for his primary or his dentist with last option come see me or Tammy but this is not thrush from an inhaler, does not cause dark spots  Follow-up by: Nyoka Cowden MD,  September 22, 2010 1:28 PM  Additional Follow-up for Phone Call Additional follow up Details #1::        Pt aware of MW's recs and will call his PCP to advise on this matter.Reynaldo Minium CMA  September 22, 2010 1:38 PM

## 2011-01-13 NOTE — Progress Notes (Signed)
Summary: needs ov  Phone Note Call from Patient Call back at Home Phone [phone]   Caller: Patient Call For: Franciszek Platten Reason for Call: Talk to Nurse Summary of Call: Pt c/o wheezing, sob since yesterday, wants to be seen today, pls advise.//[facility] Initial call taken by: Darletta Moll,  July 05, 2010 8:16 AM  Follow-up for Phone Call        called and spoke with pt---he stated that he feels like this is a flare up---he has been wheezing x 2 days with SOB at times---he had an rx from feb of prednisone and he took 4 tabs yesterday and today he feels a little better but still wheezing.  please advise.  thanks--pt denied cough or fever [address] [facility]  July 05, 2010 9:45 AM  needs ov Follow-up by: Nyoka Cowden MD,  July 05, 2010 10:15 AM  Additional Follow-up for Phone Call Additional follow up Details #1::        called and spoke with pt and he stated that he would rather come in today to see MW.  appt scheduled for 11;45 today [address] [facility]  July 05, 2010 10:21 AM

## 2011-01-13 NOTE — Assessment & Plan Note (Signed)
Summary: NP follow up - med calendar   Primary Provider/Referring Provider:  Jacky Kindle MD, [facility]  CC:  est med calendar - pt brought all meds with him today.  no complaints.  History of Present Illness:  86-yowm quit smoking age 77  with chronic cough and evidence of reversible air flow obstruction by PFTs who did not improve on Spiriva, but seemed much better in terms of dyspnea on Advair, but worse in terms of hoarseness.    Chart review did indicate that he had previously undergone upper endoscopy which showed indeed he does have a large hiatal hernia with esophageal stricture and GERD and Dr. Jarold Motto recommended that he maintain on Nexium beforeakfast daily,  which he had not been doing consistently. He was asked to take PPI perfectly regularly and switched from Advair to Symbicort and returned 06/04/08  stating that his breathing was the best it's been in years and his hoarseness is also much better.  January 28, 2010 Followup.  Pt states that his breathing has been okay.  He c/o chest congestion at bedtime and cough with clear sputum.  He also states that he tries to walk every day and notices some wheezing sometimes.  Ex tol baseline, main concern is how to handle the next exacerbation. no changes in rx   February 23, 2010 Acute visit.  Pt c/o prod cough with white to yellow sputum x 4 days.  He states that when he breaths his chest "rattles".  He had rx for amocicillin 500 mg and started taking this bid for the past 2 days. rec change to combivent and 6 days prednisone.  March 02, 2010 Acute visit.  Pt states that he was doing better with breathing until finished prednisone taper.  He c/o increased SOB x 2 days.  He also c/o increased cough- non prod.  CT was therefore done and neg.     March 11, 2010 --Returns for follow up and med review. Last visit tx w/ prednisone taper. He is feeling better w/ decreased cough/congesiton , Feels like he is approaching his baseline. Has brought all  his meds and we updated his med calendar. CT of sinus showed no acute infection . Denies chest pain, dyspnea, orthopnea, hemoptysis, fever, n/v/d, edema, headache.   Medications Prior to Update: 1)  Coq-10 30 Mg  Caps (Coenzyme Q10) .... Once Daily 2)  Centrum Silver  Tabs (Multiple Vitamins-Minerals) .... Take 1 Tablet By Mouth Once A Day 3)  Omeprazole 20 Mg Tbec (Omeprazole) .... Take 1 Tablet By Mouth Once A Day 30 Minutes Before Meal 4)  Metoprolol Tartrate 50 Mg Tabs (Metoprolol Tartrate) .... 1/2 Tab By Mouth Twice Daily 5)  Mens Prostate Health Formula   Caps (Misc Natural Products) .... Once Daily 6)  Simvastatin 20 Mg  Tabs (Simvastatin) .... Once Daily 7)  Hydrochlorothiazide 25 Mg Tabs (Hydrochlorothiazide) .... 1/2 Tab By Mouth Once Daily 8)  Benicar 20 Mg  Tabs (Olmesartan Medoxomil) .... 1/2 Once Daily 9)  Symbicort 160-4.5 Mcg/act  Aero (Budesonide-Formoterol Fumarate) .... Two Puffs Each Am and 2 Puffs Each Pm 10)  Adult Aspirin Low Strength 81 Mg  Tbdp (Aspirin) .... Once Daily 11)  Acidophilus Probiotic Blend  Caps (Misc Intestinal Flora Regulat) .Marland Kitchen.. 1 Once Daily As Needed 12)  Mucinex Dm 30-600 Mg Xr12h-Tab (Dextromethorphan-Guaifenesin) .Marland Kitchen.. 1-2 Every 12 Hours As Needed 13)  Loratadine 10 Mg Tabs (Loratadine) .... Take 1 Tablet By Mouth Once A Day As Needed Seasonal Allergies 14)  Pepcid 20  Mg Tabs (Famotidine) .... One At Bedtime 15)  Gabapentin 100 Mg Caps (Gabapentin) .Marland Kitchen.. 1 Once Daily As Needed 16)  Vitamin D 1000 Unit Tabs (Cholecalciferol) .Marland Kitchen.. 1 Once Daily 17)  Combivent 18-103 Mcg/act Aero (Ipratropium-Albuterol) .... 2 Puffs As Needed For Short of Breath,  Cough Wheeze Congestion 18)  Prednisone 10 Mg  Tabs (Prednisone) .... 4 Each Am X 2 Days,  3 X 2days, 2x2 Days, and 1x2 Days  Allergies (verified): No Known Drug Allergies  Past History:  Family History: Last updated: 11/20/2008 Ht dz father, brother Neg resp dz, atopy  Social History: Last updated:  11/03/2009 Quit smoking 1980; pipe smoker x15-36yrs. alcohol - 1 drink daily (Vodka) no caffeine widowed 2 children retired Programme researcher, broadcasting/film/video for [employer]  Risk Factors: Smoking Status: quit (03/18/2008)  Past Medical History: GERD (ICD-530.81)...................................................................Marland KitchenJarold Motto     - See positive endoscopy report 05/03/2006 showing esophageal stricture and hiatal hernia CAD (ICD-414.00) HYPERTENSION (ICD-401.9) HYPERLIPIDEMIA (ICD-272.4) COUGH, CHRONIC (ICD-786.2)     - Sinus CT neg March 02, 2010  COPD (ICD-496).......................................................................Marland KitchenWert    - PFTs 06/04/08 FEV1 51% ratio 36% diffusing capacity 74% 12% response to bronchodilator    - HFA 75% January 28, 2010 >  February 23, 2010 75% > 100% March 02, 2010  Complex med regimen    -Meds reviewed with pt education and computerized med calendar completed/ -December 15, 2008, adjusted March 11, 2010      Review of Systems      See HPI  Vital Signs:  Patient profile:   77 year old male Height:      69 inches Weight:      172.31 pounds BP sitting:   104 / 66  (right arm) Cuff size:   regular  Vitals Entered By: Boone Master CNA (March 11, 2010 2:09 PM) CC: est med calendar - pt brought all meds with him today.  no complaints Is Patient Diabetic? No Comments Medications reviewed with patient Daytime contact number verified with patient. Boone Master CNA  March 11, 2010 2:11 PM    Physical Exam  Additional Exam:  GEN: A/Ox3; pleasant , NAD wt 176 > 172 February 23, 2010 > 171 March 02, 2010 >>172 March 11, 2010 HEENT:  [place]/AT, , EACs-clear, TMs-wnl, NOSE-clear, THROAT-clear NECK:  Supple w/ fair ROM; no JVD; normal carotid impulses w/o bruits; no thyromegaly or nodules palpated; no lymphadenopathy. RESP  Coarse BS w/ no wheezing.  CARD:  RRR, no m/r/g   GI:   Soft & nt; nml bowel sounds; no organomegaly or masses detected. [place]:  Warm bil,  no calf tenderness edema, clubbing, pulses intact     Impression & Recommendations:  Problem # 1:  COPD (ICD-496) Recent flare now resolved w/ steroids.  Meds reviewed with pt education and computerized med calendar completed/adjusted.    follow up 6-8 weeks Dr. Sherene Sires and as needed   Complete Medication List: 1)  Coq-10 30 Mg Caps (Coenzyme q10) .... Once daily 2)  Centrum Silver Tabs (Multiple vitamins-minerals) .... Take 1 tablet by mouth once a day 3)  Omeprazole 20 Mg Tbec (Omeprazole) .... Take 1 tablet by mouth once a day 30 minutes before meal 4)  Metoprolol Tartrate 50 Mg Tabs (Metoprolol tartrate) .... 1/2 tab by mouth twice daily 5)  Mens Prostate Health Formula Caps (Misc natural products) .... Once daily 6)  Simvastatin 20 Mg Tabs (Simvastatin) .... Once daily 7)  Hydrochlorothiazide 25 Mg Tabs (Hydrochlorothiazide) .... 1/2 tab by mouth once  daily 8)  Benicar 20 Mg Tabs (Olmesartan medoxomil) .... 1/2 once daily 9)  Symbicort 160-4.5 Mcg/act Aero (Budesonide-formoterol fumarate) .... Two puffs each am and 2 puffs each pm 10)  Adult Aspirin Low Strength 81 Mg Tbdp (Aspirin) .... Once daily 11)  Acidophilus Probiotic Blend Caps (Misc intestinal flora regulat) .Marland Kitchen.. 1 once daily as needed 12)  Mucinex Dm 30-600 Mg Xr12h-tab (Dextromethorphan-guaifenesin) .Marland Kitchen.. 1-2 every 12 hours as needed 13)  Loratadine 10 Mg Tabs (Loratadine) .... Take 1 tablet by mouth once a day as needed seasonal allergies 14)  Pepcid 20 Mg Tabs (Famotidine) .... One at bedtime 15)  Gabapentin 100 Mg Caps (Gabapentin) .Marland Kitchen.. 1 once daily as needed 16)  Vitamin D 1000 Unit Tabs (Cholecalciferol) .Marland Kitchen.. 1 once daily 17)  Combivent 18-103 Mcg/act Aero (Ipratropium-albuterol) .... 2 puffs as needed for short of breath,  cough wheeze congestion 18)  Prednisone 10 Mg Tabs (Prednisone) .... 4 each am x 2 days,  3 x 2days, 2x2 days, and 1x2 days  Other Orders: Est. Patient Level III (04540)  Patient  Instructions: 1)  Continue on same meds.  2)  Follow med calendar closely and bring to each visit.  3)  Please contact office for sooner follow up if symptoms do not improve or worsen  4)  follow up Dr. Sherene Sires in 6-8 weeks.

## 2011-01-13 NOTE — Assessment & Plan Note (Signed)
Summary: Pulmonary/ f/u ov with hfa teaching   Primary Provider/Referring Provider:  Jacky Kindle MD, [facility]  CC:  Followup.  Pt states that his breathing has been okay.  He c/o chest congestion at bedtime and cough with clear sputum.  He also states that he tries to walk every day and notices some wheezing sometimes. .  History of Present Illness:  86-yowm quit smoking age 77  with chronic cough and evidence of reversible air flow obstruction by PFTs who did not improve on Spiriva, but seemed much better in terms of dyspnea on Advair, but worse in terms of hoarseness.    Chart review did indicate that he had previously undergone upper endoscopy which showed indeed he does have a large hiatal hernia with esophageal stricture and GERD and Dr. Jarold Motto recommended that he maintain on Nexium beforeakfast daily,  which he had not been doing consistently. He was asked to take PPI perfectly regularly and switched from Advair to Symbicort and returned 06/04/08  stating that his breathing was the best it's been in years and his hoarseness is also much better.  January 28, 2010 Followup.  Pt states that his breathing has been okay.  He c/o chest congestion at bedtime and cough with clear sputum.  He also states that he tries to walk every day and notices some wheezing sometimes.  Ex tol baseline, main concern is how to handle the next exacerbation.. Pt denies any significant sore throat, dysphagia, itching, sneezing,  nasal congestion or excess secretions,  fever, chills, sweats, unintended wt loss, pleuritic or exertional cp, hempoptysis, change in activity tolerance  orthopnea pnd or leg swelling       Current Medications (verified): 1)  Coq-10 30 Mg  Caps (Coenzyme Q10) .... Once Daily 2)  Centrum Silver  Tabs (Multiple Vitamins-Minerals) .... Take 1 Tablet By Mouth Once A Day 3)  Omeprazole 20 Mg Tbec (Omeprazole) .... Take 1 Tablet By Mouth Once A Day 30 Minutes Before Meal 4)  Metoprolol  Tartrate 50 Mg Tabs (Metoprolol Tartrate) .... 1/2 Tab By Mouth Twice Daily 5)  Mens Prostate Health Formula   Caps (Misc Natural Products) .... Once Daily 6)  Simvastatin 20 Mg  Tabs (Simvastatin) .... Once Daily 7)  Hydrochlorothiazide 25 Mg Tabs (Hydrochlorothiazide) .... 1/2 Tab By Mouth Once Daily 8)  Benicar 20 Mg  Tabs (Olmesartan Medoxomil) .... 1/2 Once Daily 9)  Symbicort 160-4.5 Mcg/act  Aero (Budesonide-Formoterol Fumarate) .... Two Puffs Each Am and 2 Puffs Each Pm 10)  Adult Aspirin Low Strength 81 Mg  Tbdp (Aspirin) .... Once Daily 11)  Acidophilus Probiotic Blend  Caps (Misc Intestinal Flora Regulat) .Marland Kitchen.. 1 Once Daily As Needed 12)  Proventil Hfa 108 (90 Base) Mcg/act  Aers (Albuterol Sulfate) .Marland Kitchen.. 1-2 Puffs Every 4-6 Hours As Needed For Breathing Difficulty (Rescue Inhaler) 13)  Mucinex Dm 30-600 Mg Xr12h-Tab (Dextromethorphan-Guaifenesin) .Marland Kitchen.. 1-2 Every 12 Hours As Needed 14)  Loratadine 10 Mg Tabs (Loratadine) .... Take 1 Tablet By Mouth Once A Day As Needed Seasonal Allergies 15)  Aerochamber .... Use Symbicort Daily  Allergies (verified): No Known Drug Allergies  Past History:  Past Medical History: GERD (ICD-530.81)...................................................................Marland KitchenJarold Motto     - See positive endoscopy report 05/03/2006 showing esophageal stricture and hiatal hernia CAD (ICD-414.00) HYPERTENSION (ICD-401.9) HYPERLIPIDEMIA (ICD-272.4) COUGH, CHRONIC (ICD-786.2) COPD (ICD-496).....................................................................Marland KitchenWert    - PFTs 06/04/08 FEV1 51% ratio 36% diffusing capacity 74% 12% response to bronchodilator    - HFA 75% January 28, 2010  Complex med regimen    -  Meds reviewed with pt education and computerized med calendar completed/adjusted. -December 15, 2008     Vital Signs:  Patient profile:   77 year old male Weight:      176 pounds O2 Sat:      97 % on Room air Temp:     97.0 degrees F oral Pulse rate:    96 / minute BP sitting:   110 / 60  (left arm)  Vitals Entered By: Vernie Murders (January 28, 2010 10:48 AM)  O2 Flow:  Room air  Physical Exam  Additional Exam:  GEN: A/Ox3; pleasant , NAD HEENT:  [place]/AT, , EACs-clear, TMs-wnl, NOSE-clear, THROAT-clear NECK:  Supple w/ fair ROM; no JVD; normal carotid impulses w/o bruits; no thyromegaly or nodules palpated; no lymphadenopathy. RESP  Distant bs with minimal exp rhonchi  CARD:  RRR, no m/r/g   GI:   Soft & nt; nml bowel sounds; no organomegaly or masses detected. Musco: Warm bil,  no calf tenderness edema, clubbing, pulses intact     CXR  Procedure date:  01/28/2010  Findings:        Comparison: PA and lateral chest 11/22/2008.   Findings: The patient is status post CABG.  Chest is hyperexpanded but the lungs are clear.  No effusion.  Heart size normal.   IMPRESSION: No acute finding.  Stable compared to prior exam.  Impression & Recommendations:  Problem # 1:  COPD (ICD-496) GOLD II/III with reversible component and tendency to exacerbate   Each maintenance medication was reviewed in detail including most importantly the difference between maintenance and as needed and under what circumstances the prns are to be used. This was done in the context of a medication calendar review which provided the patient with a user-friendly unambiguous mechanism for medication administration and reconciliation and provides an action plan for all active problems. It is critical that this be shown to every doctor  for modification during the office visit if necessary so the patient can use it as a working document.   Ok to use 6 day cycle of prednisone if flares  I spent extra time with the patient today explaining optimal mdi  technique.  This improved from  50-75%  Problem # 2:  GERD (ICD-530.81) Some of his nocturnal symptoms could well be GERD related His updated medication list for this problem includes:    Omeprazole 20 Mg Tbec  (Omeprazole) .Marland Kitchen... Take 1 tablet by mouth once a day 30 minutes before meal    Pepcid 20 Mg Tabs (Famotidine) ..... One at bedtime   NB the  ramp to expected improvement (and for that matter, worsening, if a chronic effective medication is stopped)  can be measured in weeks, not days, a common misconception because this is not Heartburn with no immediate cause and effect relationship so that response to therapy or lack thereof can be very difficult to assess.   Medications Added to Medication List This Visit: 1)  Metoprolol Tartrate 50 Mg Tabs (Metoprolol tartrate) .... 1/2 tab by mouth twice daily 2)  Pepcid 20 Mg Tabs (Famotidine) .... One at bedtime  Other Orders: HFA Instruction [phone]) Est. Patient Level III (60454) T-2 View CXR (71020TC)  Patient Instructions: 1)  See Tammy NP w/in 6 weeks with all your medications, even over the counter meds, separated in two separate bags, the ones you take no matter what vs the ones you stop once you feel better and take only as needed.  She will generate for you  a new user friendly medication calendar that will put [place] all on the same page re: your medication use. 2)  I will see you in 3 months for PFT's  3)  Work on inhaler technique:  relax and blow all the way out then take a nice smooth deep breath back in, triggering the inhaler at same time you start breathing in  4)  Change omeprazole to 20 mg Take  one 30-60 min before first meal of the day and add pepcid 20mg  at bedtime 5)  GERD (REFLUX)  is a common cause of respiratory symptoms. It commonly presents without heartburn and can be treated with medication, but also with lifestyle changes including avoidance of late meals, excessive alcohol, smoking cessation, and avoid fatty foods, chocolate, peppermint, colas, red wine, and acidic juices such as orange juice. NO MINT OR MENTHOL PRODUCTS SO NO COUGH DROPS  6)  USE SUGARLESS CANDY INSTEAD (jolley ranchers)  7)  NO OIL BASED VITAMINS  8)  See  calendar for specific medication instructions and bring it back for each and every office visit for every healthcare provider you see.  Without it,  you may not receive the best quality medical care that we feel you deserve.

## 2011-03-08 ENCOUNTER — Other Ambulatory Visit: Payer: Self-pay | Admitting: Adult Health

## 2011-03-28 ENCOUNTER — Other Ambulatory Visit: Payer: Self-pay | Admitting: *Deleted

## 2011-03-28 DIAGNOSIS — E785 Hyperlipidemia, unspecified: Secondary | ICD-10-CM

## 2011-03-28 MED ORDER — SIMVASTATIN 40 MG PO TABS: 40.0000 mg | ORAL_TABLET | Freq: Every evening | ORAL | Status: DC

## 2011-04-06 ENCOUNTER — Other Ambulatory Visit: Payer: Self-pay | Admitting: *Deleted

## 2011-04-06 DIAGNOSIS — E7800 Pure hypercholesterolemia, unspecified: Secondary | ICD-10-CM

## 2011-04-12 ENCOUNTER — Encounter: Payer: Self-pay | Admitting: Cardiovascular Disease

## 2011-04-12 DIAGNOSIS — I2510 Atherosclerotic heart disease of native coronary artery without angina pectoris: Secondary | ICD-10-CM | POA: Insufficient documentation

## 2011-04-12 DIAGNOSIS — I5189 Other ill-defined heart diseases: Secondary | ICD-10-CM | POA: Insufficient documentation

## 2011-04-12 DIAGNOSIS — C61 Malignant neoplasm of prostate: Secondary | ICD-10-CM | POA: Insufficient documentation

## 2011-04-12 DIAGNOSIS — I519 Heart disease, unspecified: Secondary | ICD-10-CM | POA: Insufficient documentation

## 2011-04-19 ENCOUNTER — Other Ambulatory Visit (INDEPENDENT_AMBULATORY_CARE_PROVIDER_SITE_OTHER): Payer: Medicare Other | Admitting: *Deleted

## 2011-04-19 ENCOUNTER — Encounter: Payer: Self-pay | Admitting: Cardiovascular Disease

## 2011-04-19 ENCOUNTER — Ambulatory Visit (INDEPENDENT_AMBULATORY_CARE_PROVIDER_SITE_OTHER): Payer: Medicare Other | Admitting: Cardiovascular Disease

## 2011-04-19 VITALS — BP 132/88 | HR 74 | Ht 68.0 in | Wt 166.8 lb

## 2011-04-19 DIAGNOSIS — E7800 Pure hypercholesterolemia, unspecified: Secondary | ICD-10-CM

## 2011-04-19 DIAGNOSIS — I2510 Atherosclerotic heart disease of native coronary artery without angina pectoris: Secondary | ICD-10-CM

## 2011-04-19 LAB — HEPATIC FUNCTION PANEL
Albumin: 3.8 g/dL (ref 3.5–5.2)
Alkaline Phosphatase: 62 U/L (ref 39–117)
Total Protein: 5.8 g/dL — ABNORMAL LOW (ref 6.0–8.3)

## 2011-04-19 LAB — BASIC METABOLIC PANEL
BUN: 25 mg/dL — ABNORMAL HIGH (ref 6–23)
CO2: 31 mEq/L (ref 19–32)
Calcium: 9.4 mg/dL (ref 8.4–10.5)
Creatinine, Ser: 1.4 mg/dL (ref 0.4–1.5)
Glucose, Bld: 86 mg/dL (ref 70–99)
Sodium: 137 mEq/L (ref 135–145)

## 2011-04-19 LAB — LIPID PANEL
Cholesterol: 173 mg/dL (ref 0–200)
HDL: 71.4 mg/dL (ref >39.00)

## 2011-04-19 NOTE — Progress Notes (Signed)
Cristian [name] Date of Birth  1923-10-13 [facility] / [facility] [address]     [address] [place], [place]  [address] [phone]  Fax  [phone]  History of Present Illness: Cristian [name] presents for followup. He has a history of coronary artery disease and is status post coronary artery bypass grafting. He presented to me in January with some left arm pain. We've thought that it may be due to angina but he did not want to have a Myoview study. The pain has since gone away and he's not had any further problems. He's able to do most of his normal activities without any significant shortness breath or chest pain.  He does have occasional shortness breath due to COPD.  He's exercising several times a week.  Current Outpatient Prescriptions on File Prior to Visit  Medication Sig Dispense Refill  . Albuterol (PROVENTIL IN) Inhale into the lungs as needed.        Marland Kitchen aspirin 81 MG tablet Take 81 mg by mouth daily.        . Cholecalciferol (VITAMIN D) 1000 UNITS capsule Take 1,000 Units by mouth daily.        Marland Kitchen co-enzyme Q-10 30 MG capsule Take 30 mg by mouth daily.        . COMBIVENT 18-103 MCG/ACT inhaler USE 2 PUFFS EVERY 3 HOURSAS NEEDED FOR SHORTNESSOF BREATH, COUGH, WHEEZE,OR CONGESTION.  14.7 each  11  . gabapentin (NEURONTIN) 100 MG capsule Take 100 mg by mouth daily.        . hydrochlorothiazide (,MICROZIDE/HYDRODIURIL,) 12.5 MG capsule Take 12.5 mg by mouth daily.        . Melatonin 3 MG TABS Take by mouth as needed.        . metoprolol succinate (TOPROL-XL) 25 MG 24 hr tablet Take 25 mg by mouth 2 (two) times daily.        . Multiple Vitamin (MULTIVITAMIN) tablet Take 1 tablet by mouth daily. 1/2 daily      . olmesartan (BENICAR) 5 MG tablet Take 10 mg by mouth daily.        . SYMBICORT 160-4.5 MCG/ACT inhaler USE 2 PUFFS EVERY MORNINGAND EVENING  1 Inhaler  11  . DISCONTD: simvastatin (ZOCOR) 40 MG tablet Take 1 tablet (40 mg total) by mouth every evening.   30 tablet  11  . famotidine (PEPCID) 10 MG tablet Take 30 mg by mouth daily.        Marland Kitchen loratadine (CLARITIN) 10 MG tablet Take 20 mg by mouth daily.        Marland Kitchen omeprazole (PRILOSEC) 20 MG capsule Take 20 mg by mouth daily.          No Known Allergies  Past Medical History  Diagnosis Date  . Coronary artery disease   . Systolic dysfunction, left ventricle   . Hypertension   . COPD (chronic obstructive pulmonary disease)   . Hyperlipidemia   . Prostate cancer     Past Surgical History  Procedure Date  . Coronary artery bypass graft 1996  . Colonoscopy w/ polypectomy 2002  . Cardiac catheterization 2002    History  Smoking status  . Former Smoker  . Types: Pipe  . Quit date: 04/18/1981  Smokeless tobacco  . Not on file    History  Alcohol Use No    No family history on file.  Reviw of Systems:  Reviewed in the HPI.  All other systems are negative.  Physical Exam: BP  132/88  Pulse 74  Ht 5\' 8"  (1.727 m)  Wt 166 lb 12.8 oz (75.66 kg)  BMI 25.36 kg/m2 The patient is alert and oriented x 3.  The mood and affect are normal.  The skin is warm and dry.  Color is normal.  The HEENT exam reveals that the sclera are nonicteric.  The mucous membranes are moist.  The carotids are 2+ without bruits.  There is no thyromegaly.  There is no JVD.  The lungs are clear.  The chest wall is non tender.  The heart exam reveals a regular rate with a normal S1 and S2.  There are no murmurs, gallops, or rubs.  The PMI is not displaced.   Abdominal exam reveals good bowel sounds.  There is no guarding or rebound.  There is no hepatosplenomegaly or tenderness.  There are no masses.  Exam of the legs reveal no clubbing, cyanosis, or edema.  The legs are without rashes.  The distal pulses are intact.  Cranial nerves II - XII are intact.  Motor and sensory functions are intact.  The gait is normal.  Assessment / Plan:

## 2011-04-19 NOTE — Assessment & Plan Note (Signed)
Cristian [name] presents for followup of his coronary artery disease. At this point I don't think that he has significant blockages. His left arm pain has resolved and so most likely was due to muscular skeletal etiology. He overall seems to be doing quite well. I'll see him again in 6 months.

## 2011-04-20 ENCOUNTER — Telehealth: Payer: Self-pay | Admitting: *Deleted

## 2011-04-20 NOTE — Telephone Encounter (Signed)
Patient called with lab results. Pt verbalized understanding. Copy mailed, Alfonso Ramus RN

## 2011-04-20 NOTE — Telephone Encounter (Signed)
Message copied by Mahalia Longest on Wed Apr 20, 2011  9:50 AM ------      Message from: Kristeen Miss      Created: Tue Apr 19, 2011  5:12 PM       Looks good

## 2011-04-29 NOTE — Procedures (Signed)
[place]. [facility]  Patient:    Cristian [name], Cristian [name]. Visit Number: [id number] MRN: [id number]          Service Type: Attending:  Florencia Reasons, M.D. Dictated by:   Florencia Reasons, M.D. Proc. Date: 09/03/01   CC:         Sharlet Salina T. Hoxworth, M.D.  Richard A. Jacky Kindle, M.D.  Alvia Grove., M.D.   Procedure Report  PROCEDURE:  Colonoscopy with polypectomy.  INDICATION:  A 77 year old gentleman being considered for elective hemorrhoid surgery, for which preoperative evaluation has been requested with screening colonoscopy.  There has been a subtle change in bowel habits characterized by softness of his stools to which he is not accustomed.  FINDINGS:  Small sessile polyp in the cecum, otherwise normal.  PROCEDURE:  The nature, purpose, and risks of the procedure had been discussed with the patient, who provided written consent.  Sedation was fentanyl 70 mcg and Versed 7 mg IV without arrhythmias or desaturation.  The Olympus pediatric adjustable-tension video colonoscope was advanced quite easily to the cecum, turned the patient into the supine position and with some external abdominal compression to facilitate passage.  The cecum was identified by visualization of the ileocecal valve and in fact, the terminal ileum was entered for a short distance and appeared normal.  Pullback was then performed.  At the base of the cecum was a 3 x 7 mm sessile polyp, which was very benign in appearance.  It was fairly flat, but formed a slight ridge along the base of the cecum.  I injected it with a total of about 1 cc of 1:10000 epinephrine causing some slight elevation and moderate blanching, and then it was snared off in a couple of pieces, which were retrieved by suctioning through the scope for histologic analysis.  There was good hemostasis and no evidence of excessive cautery at the polypectomy site.  No other polyps were seen anywhere in the  colon, nor did I see any evidence of cancer, colitis, vascular malformations or diverticular disease.  Retroflexion in the rectum, as well as pullout through the anal canal demonstrated some moderate internal hemorrhoids, which in fact were prolapsed at the beginning of the procedure but underwent easy digital reduction prior to insertion of the scope.  The patient tolerated this procedure well and there were no apparent complications.  IMPRESSION: 1. Cecal polyp, removed. 2. Moderate internal hemorrhoids.  PLAN:  Await pathology on the polyp. Dictated by:   Florencia Reasons, M.D. Attending:  Florencia Reasons, M.D. DD:  09/03/01 TD:  09/03/01 Job: [id number]

## 2011-04-29 NOTE — Cardiovascular Report (Signed)
[place]. [facility]  Patient:    Cristian [name], Cristian [name]                     MRN: [id number] Proc. Date: 01/17/01 Adm. Date:  [id number] Disc. Date: [id number] Attending:  Koren Bound CC:         Richard A. Jacky Kindle, M.D.   Cardiac Catheterization  HISTORY OF PRESENT ILLNESS:  Mr. Hairfield is a 77 year old gentleman with a history of coronary artery bypass grafting in 1996.  He presented with episodes of chest pain and arm pain.  He was scheduled for a heart catheterization.  PROCEDURE:  Left cardiac catheterization with coronary angiography with PTCA and stenting of his right coronary artery.  DESCRIPTION OF PROCEDURE:  The right femoral artery was easily cannulated using modified Seldinger technique.  HEMODYNAMICS:  The left ventricular pressure was 182/19 with aortic pressure of 181/98.  ANGIOGRAPHY:  The left main coronary artery is relatively smooth and normal.  The left anterior descending artery is occluded proximally.  The left circumflex artery is a moderate to large vessel.  It gives off a large first obtuse marginal artery with a 50% stenosis proximally.  The right coronary artery is a very large and ectatic vessel.  There is sluggish [place] 1 flow through the proximal and midsegment of the vessel.  The vessel is then occluded and there is no significant distal runoff.  The first diagonal vessel is a large vessel.  The anastomosis is normal.  The first diagonal vessel is unremarkable.  There is a large collateral vessel that fills from the diagonal vessel to the distal right coronary artery/posterior descending artery.  This vessel fills the distal right coronary artery with fairly brisk flow.  The saphenuos vein graft to the obtuse marginal system is a moderate size graft.  There is a 30 to 40% stenosis in the midportion of the graft.  The anastomosis is normal.  The native first OM has diffuse 30 to 40% stenosis throughout its  course.  The left internal mammary artery is widely patent.  The anastomosis to the left anterior descending artery is normal.  There is brisk flow down the LAD. There is some competitive flow from the diagonal vessel and diagonal graft.  The left ventriculogram was performed in a 30 RAO position.  It reveals normal left ventricular systolic function.  There is some mild hypokinesis involving the inferior base.  The overall ejection fraction is approximately 50 to 55%.  PTCA:  The 6 French sheath was traded out for a 7 [place] sheath.  The patient was given 3900 units of heparin followed by a double bolus Integrilin drip. The right coronary artery was cannulated using a Judkins right 4 7 [place] guide. An 0.14 Patriot guide wire was placed down in the distal right coronary artery.  This was followed by a 3.5 x 20 mm Quantum Monorail balloon.  A total of three inflations were performed in the distal vessel:  3 atmospheres for 42 seconds, 6 atmospheres for 61 seconds, 10 atmospheres for 61 seconds, and 14 atmospheres for 62 seconds.  This resulted in improvement of the blood flow. There was a large plaque burden in the distal right coronary artery as well as some haziness in the distal aspect of the vessel.  A 3.5 x 28 mm Pinta stent was positioned across the large atheromatous plaque.  It was deployed at 12 atmospheres for 43 seconds.  This resulted in a marked improvement of  the vessel lumen.  There was some haziness just distal to the stent.  At this point a 3.5 x 15 mm Pinta stent was positioned just distal to the first stent. This was deployed at 14 atmospheres for 39 seconds.  The balloon was then pulled back to the overlapped section between the two stents and was inflated up to 16 atmospheres for 24 seconds.  The balloon was then pulled back into the proximal part of the first stent and was inflated up to 16 atmospheres for 31 seconds.  This resulted in a markedly improved vessel  lumen with no haziness.  There is no evidence of residual stenosis.  There is brisk flow all the way down to the posterior descending artery.  COMPLICATIONS:  None.  CONCLUSION: 1. Successful percutaneous transluminal coronary angioplasty and stenting    of the distal right coronary artery. 2. Patent saphenuos vein grafts to the diagonal vessel, first obtuse marginal    artery, and a patent IMA graft to the LAD. 3. Normal left ventricular systolic function. DD:  01/17/01 TD:  01/18/01 Job: [id number]

## 2011-04-29 NOTE — Op Note (Signed)
[place]. [facility]  Patient:    Cristian [name], Cristian [name] Visit Number: [id number] MRN: [id number]          Service Type: DSU Location: [facility] Attending Physician:  Delsa Bern Dictated by:   Lorne Skeens. Hoxworth, M.D. Proc. Date: 10/02/01                             Operative Report  PREOPERATIVE DIAGNOSIS:  Grade 4 hemorrhoids.  POSTOPERATIVE DIAGNOSIS:  Grade 4 hemorrhoids.  PROCEDURE:  Hemorrhoidectomy and rubber band ligation.  SURGEON:  Lorne Skeens. Hoxworth, M.D.  ANESTHESIA:  Laryngeal mask general.  BRIEF HISTORY:  [name] is a 77 year old male with a long history of gradually worsening symptoms of irritation, mucous discharge, and mild bleeding.  Examination in the office reveals grade 4 prolapsed internal hemorrhoids.  Hemorrhoidectomy has been recommended and accepted.  He has has had a recent colonoscopy.  The nature of the procedure, its indications, risks of bleeding and infection, were discussed and understood.  He was now brought to the operating room for this procedure.  DESCRIPTION OF PROCEDURE:  The patient was brought to the operating room and placed in the supine position on the operating table and laryngeal mask general anesthesia was induced.  He was carefully positioned in lithotomy position and the perineum was sterilely prepped and draped.  He received preoperative antibiotics and Fleets enema.  There were chronically prolapsed internal hemorrhoids.  The anus was gently dilated and a retractor placed and the anal circumference carefully inspected.  In the left lateral position were large, chronically prolapsed internal hemorrhoids extending down to the external area as well.  In the right anterior and right posterior position were smaller internal hemorrhoids.  Initially the area on the left was dressed.  Tissue was infiltrated with local anesthesia with epinephrine.  A 3-0 chromic suture was placed at the apex of the  hemorrhoidal group, which included a fair amount of mucosal prolapse as well.  This area was then completely elliptically excised, carrying out on the anoderm and carefully dissected up off of the anal sphincter, which was preserved, and the entire hemorrhoidal group and redundant mucosa excised.  This was then closed with the chromic suture in the running locked fashion with good hemostasis.  In the right anterior and right posterior positions, the hemorrhoids were not prolapsed and were limited to above the dentate line and in two areas, rubber band ligation was performed in the right anterior and right posterior position with good eradication of all significant hemorrhoids on this side.  The perianal area was infiltrated with local anesthesia.  A dry gauze dressing was applied and the patient taken to the recovery room in good condition. Dictated by:   Lorne Skeens. Hoxworth, M.D. Attending Physician:  Delsa Bern DD:  10/02/01 TD:  10/03/01 Job: [id number]

## 2011-04-29 NOTE — Discharge Summary (Signed)
[place]  Patient:    Cristian [name], Cristian [name]. Visit Number: [id number] MRN: [id number]          Service Type: Attending:  Alvia Grove., M.D. Adm. Date:  01/17/01 Disc. Date: 01/18/01   CC:         Richard A. Jacky Kindle, M.D.   Discharge Summary  DISCHARGE DIAGNOSES: 1. Coronary artery disease - status post percutaneous transluminal coronary    angioplasty and stenting of an occluded right coronary artery. 2. History of coronary artery bypass grafting with left internal mammary    artery to left anterior descending, saphenous vein graft to first diagonal,    saphenous vein graft to obtuse marginal 1. 3. Hypertension.  DISCHARGE MEDICATIONS: 1. Enteric-coated aspirin 325 mg a day. 2. Plavix 75 mg a day for 28 days. 3. Hydrochlorothiazide 25 mg a day. 4. Lopressor 25 mg a day. 5. ______ 10 once a day.  DISPOSITION:  The patient is to eat a low fat, low salt and low cholesterol diet.  He is to watch for any signs of bleeding.  He is to see Dr. Elease Hashimoto in the office in one week.  HISTORY:  Cristian [name] is a 77 year old gentleman with a history of coronary artery disease - status post coronary artery bypass grafting.  He recently presented with symptoms of arm heaviness and chest heaviness quite similar to his presenting symptoms five years ago.  Because of his symptoms and the relief with nitroglycerin, he was referred for heart catheterization.  HOSPITAL COURSE:  CHEST PAIN/ARM PAIN:  The patient had a diagnostic heart catheterization which revealed a complete occlusion of his distal right coronary artery.  He did have some collateral filling from the diagonal vessel via the saphenous vein graft/diagonal system.  He underwent successful PTCA and stenting of the right coronary artery using two 3.5 mm stents (a 3.5 x 28 mm Penta stent with a 3.5 x 12 mm Penta stent placed just distal to the initial stent).  These stents were deployed up to  16 and 17 atmospheres.  The patient tolerated the procedure quite well.  He did not have any EKG changes or episodes of chest pain.  He will be sent home on the above-noted medications and disposition.  All of his other medical problems are stable and will be addressed by Dr. Jacky Kindle. Attending:  Alvia Grove., M.D. DD:  01/18/01 TD:  01/19/01 Job: [id number]

## 2011-04-29 NOTE — Assessment & Plan Note (Signed)
[facility]                               PULMONARY OFFICE NOTE   NAME:Cristian [name], Cristian [name]                     MRN:          [id number]  DATE:07/04/2006                            DOB:          June 29, 1923    PULMONARY/FINAL SUMMARY OFFICE VISIT   HISTORY:  An 77 year old, white male, remote smoker, with chronic cough and  evidence of reversible air flow obstruction by PFTs who did not improve on  Spiriva, but seemed much better in terms of dyspnea on Advair, but worse in  terms of hoarseness.  I thought he either was having adverse effect from DPI  Advair, reflux or both.  In the meantime, he underwent an upper endoscopy on  May 23, that showed indeed he does have a large hiatal hernia with  esophageal stricture and GERD and Dr. Jarold Motto recommended that he maintain  on Nexium before breakfast daily.   In the meantime, the patient has had difficulty with understanding and  processing instructions and saw a nurse practitioner for medication calendar  which has been changed multiple times since it was actually written and now  has been restarted on ACE inhibitors because of blood pressure was too  high.  Apparently, he did not mention to Dr. Elease Hashimoto that he already had a  cough.   Presently, he is having increasing cough especially when he tries to lie  down at night with minimal clear sputum and also marked hoarseness.   For full medications, please see face sheet dated July 04, 2006, but note  all the changes that have been made on the medication calendar since it was  generated.   PHYSICAL EXAMINATION:  GENERAL:  He is a hoarse, ambulatory, pleasant, white  male in no acute distress chewing gum.  VITAL SIGNS:  Afebrile with normal vital signs.  LUNGS:  Lung fields perfectly clear bilaterally to auscultation and  percussion.  HEART:  Regular rate and rhythm without murmurs, rubs or gallops.  ABDOMEN:  Soft, benign.  EXTREMITIES:  No calf  tenderness.  No clubbing, cyanosis or edema.   LABORATORY DATA AND X-RAY FINDINGS:  PFTs were reviewed with the patient  from February 06, 2006, and indicated FEV-1 of 54% predicated with an 18%  improvement after bronchodilators.   IMPRESSION:  Chronic obstructive pulmonary disease with a definite asthmatic  component suggested by pulmonary function tests in February.  A unifying  diagnosis is chronic poorly-controlled reflux resulting in both esophageal  stricture and mild asthma.  In this setting, angiotensin-converting enzyme  inhibitors are likely to fuel the fire of upper airways inflammation even  though he is improved in terms of flow air inflammation in terms of his  complaints of dyspnea.   Since cough is now his major concern, not dyspnea, I would recommend the  following:  1.  Continue Advair, but consider switching to either a lower dose or even      switching her over to Qvar long-term if the cough continues.  2.  ACE inhibitors are problematic here and really should be avoided in era  of excellent ARB therapies available.  It is not clear to me who is      responsible for treating his blood pressure at this point, but if indeed      it is Dr. Elease Hashimoto, I would recommend avoiding ARBs and otherwise defer to      Dr. Jacky Kindle in terms of coordinating all of his further followup and      care.   I do not plan to see the patient back in the pulmonary clinic and told the  patient there was not much I could do to eliminate coughing or hoarseness as  an issue in a patient with documented reflux while taking ACE inhibitors,  but that on the other hand, ACE inhibitors have good long-term benefit in  terms of cardiovascular end points and the cough itself is not a threat to  himself in the absence of upper air flow obstruction which is the case.  Pulmonary followup is p.r.n.                                   Charlaine Dalton. Sherene Sires, MD, [facility]   MBW/MedQ  DD:  07/04/2006  DT:   07/04/2006  Job #:  [id number]   cc:   Geoffry Paradise, MD  Vesta Mixer, MD

## 2011-05-16 ENCOUNTER — Other Ambulatory Visit: Payer: Self-pay | Admitting: Dermatology

## 2011-06-06 ENCOUNTER — Telehealth: Payer: Self-pay | Admitting: Internal Medicine

## 2011-06-06 NOTE — Telephone Encounter (Signed)
Request for samples should have been for symbicort 160/4.5 not spiriva--left samples of symbicort at front desk and pt aware

## 2011-07-09 ENCOUNTER — Other Ambulatory Visit: Payer: Self-pay | Admitting: Internal Medicine

## 2011-07-18 ENCOUNTER — Ambulatory Visit (INDEPENDENT_AMBULATORY_CARE_PROVIDER_SITE_OTHER): Payer: Medicare Other | Admitting: Pulmonary Disease

## 2011-07-18 ENCOUNTER — Encounter: Payer: Self-pay | Admitting: Pulmonary Disease

## 2011-07-18 VITALS — BP 102/70 | HR 98 | Temp 97.3°F | Ht 68.0 in | Wt 170.2 lb

## 2011-07-18 DIAGNOSIS — J449 Chronic obstructive pulmonary disease, unspecified: Secondary | ICD-10-CM

## 2011-07-18 DIAGNOSIS — J441 Chronic obstructive pulmonary disease with (acute) exacerbation: Secondary | ICD-10-CM

## 2011-07-18 MED ORDER — PREDNISONE (PAK) 10 MG PO TABS: 10.0000 mg | ORAL_TABLET | Freq: Every day | ORAL | Status: AC

## 2011-07-18 NOTE — Patient Instructions (Signed)
Ambulatory satn on RA Prednisone 10 mg x 1 week Get CXR report from dr Jacky Kindle We will send in referral to an exercise program - this may take a few weeks to come through

## 2011-07-18 NOTE — Progress Notes (Signed)
  Subjective:    Patient ID: Cristian [name], male    DOB: 1923-06-14, 77 y.o.   MRN: [id number]  HPI 87-yowm quit smoking age 83 with chronic cough and evidence of reversible air flow obstruction by PFTs who did not improve on Spiriva, but seemed much better in terms of dyspnea on Advair, but worse in terms of hoarseness.  Chart review did indicate that he had egd with large hiatal hernia with esophageal stricture and GERD and Dr. Jarold Motto recommended that he maintain on Nexium beforeakfast daily, which he had not been doing consistently so rec PPI  regularly and switched from Advair to Symbicort and returned 06/04/08 stating that his breathing was the best it's been in years and his hoarseness is also much better.    September 06, 2010--Presents for follow up. Had some increased wheezing was given steroid taper 3 weeks ago with improvement in breathing. Has recently been dx w/ prostate cancer. He is receiving injections and radiation . Says his breathing has returned to baseline. He does wear out easily. Not much energy since starting radiation tx.  07/18/2011 Gradual ? Flare Sick visit Dr. Sherene Sires pt: Pt c/o increased SOB with exertion x 1 week worse x last few days, productive cough with clear mucus, wheezing Prior flares seem to have responded to ABx and/ or prednisone.  Denies chest pain, orthopnea, hemoptysis, fever, n/v/d, edema, headache/ breathing ok, no changes.     Review of Systems Patient denies  hemoptysis,  chest pain, palpitations, pedal edema, orthopnea, paroxysmal nocturnal dyspnea, lightheadedness, nausea, vomiting, abdominal or  leg pains      Objective:   Physical Exam Gen. Pleasant, well-nourished, in no distress ENT - no lesions, no post nasal drip Neck: No JVD, no thyromegaly, no carotid bruits Lungs: no use of accessory muscles, no dullness to percussion, clear without rales or rhonchi  Cardiovascular: Rhythm regular, heart sounds  normal, no murmurs or gallops, no  peripheral edema Musculoskeletal: No deformities, no cyanosis or clubbing         Assessment & Plan:

## 2011-07-19 ENCOUNTER — Telehealth: Payer: Self-pay | Admitting: Pulmonary Disease

## 2011-07-19 NOTE — Telephone Encounter (Signed)
Pt wanted to know why he still had albuterol on his medication list when he was no longer taking this. I advised pt we have he is not taking it. Pt verbalized understanding and had no further questions

## 2011-07-22 NOTE — Assessment & Plan Note (Signed)
Ambulatory satn on RA - dropped from 94 to 91% Prednisone 10 mg x 1 week Get CXR report from dr Jacky Kindle We will send in referral to pulm rehab  program - this may take a few weeks to come through

## 2011-08-17 ENCOUNTER — Ambulatory Visit (INDEPENDENT_AMBULATORY_CARE_PROVIDER_SITE_OTHER): Payer: Medicare Other | Admitting: Internal Medicine

## 2011-08-17 ENCOUNTER — Encounter: Payer: Self-pay | Admitting: Internal Medicine

## 2011-08-17 VITALS — BP 92/60 | HR 96 | Temp 97.8°F | Ht 67.0 in | Wt 167.0 lb

## 2011-08-17 DIAGNOSIS — J449 Chronic obstructive pulmonary disease, unspecified: Secondary | ICD-10-CM

## 2011-08-17 NOTE — Progress Notes (Signed)
  Subjective:    Patient ID: Cristian [name], male    DOB: 29-Jun-1923, 77 y.o.   MRN: [id number]  HPI  77-yowm quit smoking age 89 with chronic cough and evidence of reversible air flow obstruction by PFTs who did not improve on Spiriva, but seemed much better in terms of dyspnea on Advair, but worse in terms of hoarseness.  Chart review did indicate that he had egd with large hiatal hernia with esophageal stricture and GERD and Dr. Jarold Motto recommended that he maintain on Nexium beforeakfast daily, which he had not been doing consistently so rec PPI  regularly and switched from Advair to Symbicort and returned 06/04/08 stating that his breathing was the best it's been in years and his hoarseness  also much better.    07/18/2011 ov Gradual ? Flare Sick visit Dr. Sherene Sires pt: Pt c/o increased SOB with exertion x 1 week worse x last few days, productive cough with clear mucus, wheezing Prior flares seem to have responded to ABx and/ or prednisone. rec Ambulatory sats on RA Prednisone 10 mg x 1 week Get CXR report from dr Jacky Kindle We will send in referral to an exercise program - this may take a few weeks to come through    08/17/2011 f/u ov/Sheilah Rayos cc weak, can walk a block slowly s stopping, no cough,  Using combivent sev x daily  Sleeping ok without nocturnal  or early am exac of resp c/o's or need for noct saba.  Pt denies any significant sore throat, dysphagia, itching, sneezing,  nasal congestion or excess/ purulent secretions,  fever, chills, sweats, unintended wt loss, pleuritic or exertional cp, hempoptysis, orthopnea pnd or leg swelling.  Also denies presyncope, palpitations, heartburn, abdominal pain, nausea, vomiting, diarrhea  or change in bowel or urinary habits, dysuria,hematuria,  rash, arthralgias, visual complaints, headache, numbness weakness or ataxia.  Also denies any obvious fluctuation of symptoms with weather or environmental changes or other aggravating or alleviating factors.     Past Medical History:  GERD (ICD-530.81)...................................................................Marland KitchenJarold Motto  - See positive endoscopy report 05/03/2006 showing esophageal stricture and hiatal hernia  CAD (ICD-414.00)  HYPERTENSION (ICD-401.9)  HYPERLIPIDEMIA (ICD-272.4)  COUGH, CHRONIC (ICD-786.2)  - Sinus CT neg March 02, 2010  COPD (ICD-496)..........................................................................Marland KitchenWert  - PFTs 06/04/08 FEV1 51% ratio 36% diffusing capacity 74% 12% response to bronchodilator  - HFA 75% January 28, 2010 > 75% January 07, 2011       Objective:   amb wm nad seems more weak than sob Wt 167 08/17/2011  HEENT mild turbinate edema.  Oropharynx no thrush or excess pnd or cobblestoning.  No JVD or cervical adenopathy. Mild accessory muscle hypertrophy. Trachea midline, nl thryroid. Chest was hyperinflated by percussion with diminished breath sounds and moderate increased exp time without wheeze. Hoover sign positive at mid inspiration. Regular rate and rhythm without murmur gallop or rub or increase P2 or edema.  Abd: no hsm, nl excursion. Ext warm without cyanosis or clubbing.    cxr 06/30/11 copd / no acute dz     Assessment & Plan:

## 2011-08-17 NOTE — Patient Instructions (Addendum)
Please see patient coordinator before you leave today  to schedule overnight pulse ox  See Tammy NP w/in 6 weeks with all your medications, even over the counter meds, separated in two separate bags, the ones you take no matter what vs the ones you stop once you feel better and take only as needed when you feel you need them.   Tammy  will generate for you a new user friendly medication calendar that will put [place] all on the same page re: your medication use.     Without this process, it simply isn't possible to assure that we are providing  your outpatient care  with  the attention to detail we feel you deserve.   If we cannot assure that you're getting that kind of care,  then we cannot manage your problem effectively from this clinic.  Once you have seen Tammy and we are sure that we're all on the same page with your medication use she will arrange follow up with me with PFTs t

## 2011-08-18 ENCOUNTER — Encounter: Payer: Self-pay | Admitting: Internal Medicine

## 2011-08-18 ENCOUNTER — Encounter (HOSPITAL_COMMUNITY): Payer: Medicare Other | Attending: Pulmonary Disease

## 2011-08-18 DIAGNOSIS — I2510 Atherosclerotic heart disease of native coronary artery without angina pectoris: Secondary | ICD-10-CM | POA: Insufficient documentation

## 2011-08-18 DIAGNOSIS — Z5189 Encounter for other specified aftercare: Secondary | ICD-10-CM | POA: Insufficient documentation

## 2011-08-18 DIAGNOSIS — E785 Hyperlipidemia, unspecified: Secondary | ICD-10-CM | POA: Insufficient documentation

## 2011-08-18 DIAGNOSIS — R059 Cough, unspecified: Secondary | ICD-10-CM | POA: Insufficient documentation

## 2011-08-18 DIAGNOSIS — K219 Gastro-esophageal reflux disease without esophagitis: Secondary | ICD-10-CM | POA: Insufficient documentation

## 2011-08-18 DIAGNOSIS — J4489 Other specified chronic obstructive pulmonary disease: Secondary | ICD-10-CM | POA: Insufficient documentation

## 2011-08-18 DIAGNOSIS — I10 Essential (primary) hypertension: Secondary | ICD-10-CM | POA: Insufficient documentation

## 2011-08-18 DIAGNOSIS — R05 Cough: Secondary | ICD-10-CM | POA: Insufficient documentation

## 2011-08-18 DIAGNOSIS — J449 Chronic obstructive pulmonary disease, unspecified: Secondary | ICD-10-CM | POA: Insufficient documentation

## 2011-08-18 NOTE — Assessment & Plan Note (Signed)
Only has GOLD II severity COPD by prev PFT"s well p quit smoking   When respiratory symptoms begin well after a patient reports complete smoking cessation,  it is very hard to "blame" COPD specifically or airways disorders in general  ie it doesn't make any more sense than hearing a  [employer] driver wrecked his car while driving his kids to school or a surgeon sliced his hand off carving roast beef (it must be rare indeed!)     That is to say, once the high risk activity stops,  the symptoms should not suddenly erupt or markedly worsen.  If so, the differential diagnosis should include  obesity/deconditioning,  LPR/Reflux/Aspiration syndromes,  occult CHF, or  especially side effect of medications commonly used in this population.    In this case strongly suspect deconditioning and strongly support rehab    Each maintenance medication was reviewed in detail including most importantly the difference between maintenance and as needed and under what circumstances the prns are to be used.  Please see instructions for details which were reviewed in writing and the patient given a copy.

## 2011-08-23 ENCOUNTER — Encounter (HOSPITAL_COMMUNITY): Payer: Medicare Other

## 2011-08-25 ENCOUNTER — Encounter: Payer: Self-pay | Admitting: Internal Medicine

## 2011-08-25 ENCOUNTER — Encounter (HOSPITAL_COMMUNITY): Payer: Medicare Other

## 2011-08-30 ENCOUNTER — Encounter (HOSPITAL_COMMUNITY): Payer: Medicare Other

## 2011-09-01 ENCOUNTER — Encounter (HOSPITAL_COMMUNITY): Payer: Medicare Other

## 2011-09-05 ENCOUNTER — Other Ambulatory Visit: Payer: Self-pay | Admitting: *Deleted

## 2011-09-05 ENCOUNTER — Ambulatory Visit (INDEPENDENT_AMBULATORY_CARE_PROVIDER_SITE_OTHER): Payer: Medicare Other

## 2011-09-05 DIAGNOSIS — Z23 Encounter for immunization: Secondary | ICD-10-CM

## 2011-09-05 MED ORDER — HYDROCHLOROTHIAZIDE 12.5 MG PO CAPS: 12.5000 mg | ORAL_CAPSULE | Freq: Every day | ORAL | Status: DC

## 2011-09-05 NOTE — Telephone Encounter (Signed)
Spoke with pt verified strength and dose. Said he was on 12.5 mg and was using 25mg  tablet and breaking it in half but now was taking whole pill because he forgot, called pharmacy and clarified he is to take 12.5mg  and is to have app. Pt verbalized understanding. Alfonso Ramus RN

## 2011-09-06 ENCOUNTER — Encounter (HOSPITAL_COMMUNITY): Payer: Medicare Other

## 2011-09-06 ENCOUNTER — Other Ambulatory Visit: Payer: Self-pay | Admitting: *Deleted

## 2011-09-06 DIAGNOSIS — K219 Gastro-esophageal reflux disease without esophagitis: Secondary | ICD-10-CM

## 2011-09-06 MED ORDER — OMEPRAZOLE 20 MG PO CPDR: 20.0000 mg | DELAYED_RELEASE_CAPSULE | Freq: Every day | ORAL | Status: DC

## 2011-09-08 ENCOUNTER — Encounter (HOSPITAL_COMMUNITY): Payer: Medicare Other

## 2011-09-08 ENCOUNTER — Telehealth: Payer: Self-pay | Admitting: Internal Medicine

## 2011-09-08 NOTE — Telephone Encounter (Signed)
Spoke with pt and notified of results per Dr. Wert. Pt verbalized understanding and denied any questions. 

## 2011-09-08 NOTE — Telephone Encounter (Signed)
Called pt to inform ONO on RA is normal. LMTCB

## 2011-09-12 ENCOUNTER — Encounter: Payer: Self-pay | Admitting: Internal Medicine

## 2011-09-13 ENCOUNTER — Encounter (HOSPITAL_COMMUNITY): Payer: Medicare Other | Attending: Pulmonary Disease

## 2011-09-13 DIAGNOSIS — R05 Cough: Secondary | ICD-10-CM | POA: Insufficient documentation

## 2011-09-13 DIAGNOSIS — J449 Chronic obstructive pulmonary disease, unspecified: Secondary | ICD-10-CM | POA: Insufficient documentation

## 2011-09-13 DIAGNOSIS — I10 Essential (primary) hypertension: Secondary | ICD-10-CM | POA: Insufficient documentation

## 2011-09-13 DIAGNOSIS — Z5189 Encounter for other specified aftercare: Secondary | ICD-10-CM | POA: Insufficient documentation

## 2011-09-13 DIAGNOSIS — I2510 Atherosclerotic heart disease of native coronary artery without angina pectoris: Secondary | ICD-10-CM | POA: Insufficient documentation

## 2011-09-13 DIAGNOSIS — R059 Cough, unspecified: Secondary | ICD-10-CM | POA: Insufficient documentation

## 2011-09-13 DIAGNOSIS — K219 Gastro-esophageal reflux disease without esophagitis: Secondary | ICD-10-CM | POA: Insufficient documentation

## 2011-09-13 DIAGNOSIS — J4489 Other specified chronic obstructive pulmonary disease: Secondary | ICD-10-CM | POA: Insufficient documentation

## 2011-09-13 DIAGNOSIS — E785 Hyperlipidemia, unspecified: Secondary | ICD-10-CM | POA: Insufficient documentation

## 2011-09-15 ENCOUNTER — Encounter (HOSPITAL_COMMUNITY): Payer: Medicare Other

## 2011-09-15 LAB — DIFFERENTIAL
Basophils Absolute: 0.1 10*3/uL (ref 0.0–0.1)
Basophils Relative: 1 % (ref 0–1)
Lymphocytes Relative: 12 % (ref 12–46)
Monocytes Absolute: 0.7 10*3/uL (ref 0.1–1.0)
Neutro Abs: 6 10*3/uL (ref 1.7–7.7)
Neutrophils Relative %: 76 % (ref 43–77)

## 2011-09-15 LAB — CBC
HCT: 46.1 % (ref 39.0–52.0)
Hemoglobin: 15.3 g/dL (ref 13.0–17.0)
RDW: 13.9 % (ref 11.5–15.5)
WBC: 7.9 10*3/uL (ref 4.0–10.5)

## 2011-09-15 LAB — URINALYSIS, ROUTINE W REFLEX MICROSCOPIC
Bilirubin Urine: NEGATIVE
Glucose, UA: NEGATIVE mg/dL
Hgb urine dipstick: NEGATIVE
Ketones, ur: NEGATIVE mg/dL
Nitrite: NEGATIVE
Protein, ur: NEGATIVE mg/dL
Specific Gravity, Urine: 1.013 (ref 1.005–1.030)
Urobilinogen, UA: 0.2 mg/dL (ref 0.0–1.0)
pH: 6 (ref 5.0–8.0)

## 2011-09-15 LAB — COMPREHENSIVE METABOLIC PANEL
Alkaline Phosphatase: 68 U/L (ref 39–117)
BUN: 17 mg/dL (ref 6–23)
Chloride: 94 mEq/L — ABNORMAL LOW (ref 96–112)
Glucose, Bld: 99 mg/dL (ref 70–99)
Potassium: 4.1 mEq/L (ref 3.5–5.1)
Total Bilirubin: 0.9 mg/dL (ref 0.3–1.2)

## 2011-09-20 ENCOUNTER — Encounter (HOSPITAL_COMMUNITY): Payer: Medicare Other

## 2011-09-22 ENCOUNTER — Encounter (HOSPITAL_COMMUNITY): Payer: Medicare Other

## 2011-09-27 ENCOUNTER — Encounter (HOSPITAL_COMMUNITY): Payer: Medicare Other

## 2011-09-29 ENCOUNTER — Encounter (HOSPITAL_COMMUNITY): Payer: Medicare Other

## 2011-10-03 ENCOUNTER — Ambulatory Visit (INDEPENDENT_AMBULATORY_CARE_PROVIDER_SITE_OTHER): Payer: Medicare Other | Admitting: Adult Health

## 2011-10-03 ENCOUNTER — Encounter: Payer: Self-pay | Admitting: Adult Health

## 2011-10-03 DIAGNOSIS — J449 Chronic obstructive pulmonary disease, unspecified: Secondary | ICD-10-CM

## 2011-10-03 NOTE — Patient Instructions (Signed)
Follow med calendar closely and bring to each visit.  follow up Dr. Sherene Sires  In 2 months and As needed

## 2011-10-03 NOTE — Progress Notes (Signed)
Subjective:    Patient ID: Cristian [name], male    DOB: [birth year], 77 y.o.   MRN: [id number]  HPI 88-yowm quit smoking age 58 with chronic cough and evidence of reversible air flow obstruction by PFTs who did not improve on Spiriva, but seemed much better in terms of dyspnea on Advair, but worse in terms of hoarseness.  Chart review did indicate that he had egd with large hiatal hernia with esophageal stricture and GERD and Dr. Jarold Motto recommended that he maintain on Nexium beforeakfast daily, which he had not been doing consistently so rec PPI  regularly and switched from Advair to Symbicort and returned 06/04/08 stating that his breathing was the best it's been in years and his hoarseness  also much better.    07/18/2011 ov Gradual ? Flare Sick visit Dr. Sherene Sires pt: Pt c/o increased SOB with exertion x 1 week worse x last few days, productive cough with clear mucus, wheezing Prior flares seem to have responded to ABx and/ or prednisone. rec Ambulatory sats on RA Prednisone 10 mg x 1 week Get CXR report from dr Jacky Kindle We will send in referral to an exercise program - this may take a few weeks to come through    08/17/2011 f/u ov/Wert cc weak, can walk a block slowly s stopping, no cough,  Using combivent sev x daily Sleeping ok without nocturnal  or early am exac of resp c/o's or need for noct saba.  >>overnight ox done > ok   10/03/2011 Follow up and Med review   Returns for follow up and med review. We reveiwed all his meds and organized his med calendar with pt education .  He has several bottles with no labels on the bottle or written in on tape. Advised on proper labeling. Doing well without flare of cough or dyspnea.  No chest pain or edema.   Past Medical History:  GERD (ICD-530.81)...................................................................Marland KitchenJarold Motto  - See positive endoscopy report 05/03/2006 showing esophageal stricture and hiatal hernia  CAD (ICD-414.00)  HYPERTENSION  (ICD-401.9)  HYPERLIPIDEMIA (ICD-272.4)  COUGH, CHRONIC (ICD-786.2)  - Sinus CT neg March 02, 2010  COPD (ICD-496)..........................................................................Marland KitchenWert  - PFTs 06/04/08 FEV1 51% ratio 36% diffusing capacity 74% 12% response to bronchodilator  - HFA 75% January 28, 2010 > 75% January 07, 2011  Complex med regimen 10/03/2011   Constitutional:   No  weight loss, night sweats,  Fevers, chills,  +fatigue, or  lassitude.  HEENT:   No headaches,  Difficulty swallowing,  Tooth/dental problems, or  Sore throat,                No sneezing, itching, ear ache, nasal congestion, post nasal drip,   CV:  No chest pain,  Orthopnea, PND, swelling in lower extremities, anasarca, dizziness, palpitations, syncope.   GI  No heartburn, indigestion, abdominal pain, nausea, vomiting, diarrhea, change in bowel habits, loss of appetite, bloody stools.   Resp:    No excess mucus, no productive cough,  No non-productive cough,  No coughing up of blood.  No change in color of mucus.  No wheezing.  No chest wall deformity  Skin: no rash or lesions.  GU: no dysuria, change in color of urine, no urgency or frequency.  No flank pain, no hematuria   MS:  No joint pain or swelling.  No decreased range of motion.               Objective:   amb wm nad seems more weak than sob  Wt 167 08/17/2011 >>166 10/03/2011  HEENT mild turbinate edema.  Oropharynx no thrush or excess pnd or cobblestoning.  No JVD or cervical adenopathy. Mild accessory muscle hypertrophy. Trachea midline, nl thryroid. Chest was hyperinflated by percussion with diminished breath sounds Regular rate and rhythm without murmur gallop or rub or increase P2 or edema.  Abd: no hsm, nl excursion. Ext warm without cyanosis or clubbing.        Assessment & Plan:

## 2011-10-03 NOTE — Progress Notes (Signed)
Addended by: Boone Master E on: 10/03/2011 04:24 PM   Modules accepted: Orders

## 2011-10-03 NOTE — Assessment & Plan Note (Signed)
Compensated on present regimen.  Patient's medications were reviewed today and patient education was given. Computerized medication calendar was adjusted/completed  Plan follow up in 2 months and As needed   Cont on current regimen

## 2011-10-04 ENCOUNTER — Encounter (HOSPITAL_COMMUNITY): Payer: Medicare Other

## 2011-10-04 ENCOUNTER — Other Ambulatory Visit: Payer: Self-pay | Admitting: *Deleted

## 2011-10-05 ENCOUNTER — Other Ambulatory Visit: Payer: Self-pay | Admitting: *Deleted

## 2011-10-05 MED ORDER — OLMESARTAN MEDOXOMIL 20 MG PO TABS: 20.0000 mg | ORAL_TABLET | Freq: Every day | ORAL | Status: DC

## 2011-10-05 NOTE — Telephone Encounter (Signed)
Fax Received. Refill Completed. Darlisha Kelm Chowoe (M.A)  

## 2011-10-05 NOTE — Telephone Encounter (Signed)
Fax Received. Refill Completed. Giovanne Nickolson Chowoe (M.A)  

## 2011-10-05 NOTE — Telephone Encounter (Signed)
Fax Received. Refill Completed. Cristian [name] (M.A)  

## 2011-10-06 ENCOUNTER — Encounter (HOSPITAL_COMMUNITY): Payer: Medicare Other

## 2011-10-11 ENCOUNTER — Encounter (HOSPITAL_COMMUNITY): Payer: Medicare Other

## 2011-10-13 ENCOUNTER — Encounter (HOSPITAL_COMMUNITY): Payer: Medicare Other

## 2011-10-13 DIAGNOSIS — R05 Cough: Secondary | ICD-10-CM | POA: Insufficient documentation

## 2011-10-13 DIAGNOSIS — K219 Gastro-esophageal reflux disease without esophagitis: Secondary | ICD-10-CM | POA: Insufficient documentation

## 2011-10-13 DIAGNOSIS — I10 Essential (primary) hypertension: Secondary | ICD-10-CM | POA: Insufficient documentation

## 2011-10-13 DIAGNOSIS — R059 Cough, unspecified: Secondary | ICD-10-CM | POA: Insufficient documentation

## 2011-10-13 DIAGNOSIS — E785 Hyperlipidemia, unspecified: Secondary | ICD-10-CM | POA: Insufficient documentation

## 2011-10-13 DIAGNOSIS — Z5189 Encounter for other specified aftercare: Secondary | ICD-10-CM | POA: Insufficient documentation

## 2011-10-13 DIAGNOSIS — I2510 Atherosclerotic heart disease of native coronary artery without angina pectoris: Secondary | ICD-10-CM | POA: Insufficient documentation

## 2011-10-13 DIAGNOSIS — J4489 Other specified chronic obstructive pulmonary disease: Secondary | ICD-10-CM | POA: Insufficient documentation

## 2011-10-13 DIAGNOSIS — J449 Chronic obstructive pulmonary disease, unspecified: Secondary | ICD-10-CM | POA: Insufficient documentation

## 2011-10-18 ENCOUNTER — Encounter (HOSPITAL_COMMUNITY): Payer: Medicare Other

## 2011-10-20 ENCOUNTER — Encounter (HOSPITAL_COMMUNITY): Payer: Medicare Other

## 2011-10-25 ENCOUNTER — Encounter (HOSPITAL_COMMUNITY): Payer: Medicare Other

## 2011-10-25 NOTE — Progress Notes (Signed)
Patient completed final 6 minute walk test for the pulmonary rehab program. Patient tolerated well and VSS, Patient showed a decrease in distance but a stable SpaO2 of 100% on RA. His goal was to increase oxygen saturation while ambulating. Patient achieved his personal goals of learning how to pace himself while ambulating and PLB.

## 2011-10-27 ENCOUNTER — Encounter (HOSPITAL_COMMUNITY)
Admission: RE | Admit: 2011-10-27 | Discharge: 2011-10-27 | Disposition: A | Discharge status: Home / Self Care | Payer: Medicare Other | Source: Ambulatory Visit | Attending: Pulmonary Disease | Admitting: Pulmonary Disease

## 2011-10-28 ENCOUNTER — Ambulatory Visit (INDEPENDENT_AMBULATORY_CARE_PROVIDER_SITE_OTHER): Payer: Medicare Other | Admitting: Cardiovascular Disease

## 2011-10-28 ENCOUNTER — Encounter: Payer: Self-pay | Admitting: Cardiovascular Disease

## 2011-10-28 DIAGNOSIS — I2510 Atherosclerotic heart disease of native coronary artery without angina pectoris: Secondary | ICD-10-CM

## 2011-10-28 MED ORDER — OLMESARTAN MEDOXOMIL 20 MG PO TABS: 10.0000 mg | ORAL_TABLET | Freq: Every day | ORAL | Status: DC

## 2011-10-28 NOTE — Assessment & Plan Note (Signed)
He's doing well from a cardiac standpoint. We'll continue with the same medications.

## 2011-10-28 NOTE — Progress Notes (Signed)
Cristian [name] Date of Birth  04-Mar-1923 [facility] [address]    [address] [place], [place]  [address] [phone]  Fax  [phone]  History of Present Illness:  Cristian [name] is a 77 y.o. gentleman with a Hx of CAD, CABG, COPD, HTN, prostate cancer and hyperlipidemia.  He's very well from a cardiac standpoint the  Current Outpatient Prescriptions on File Prior to Visit  Medication Sig Dispense Refill  . acetaminophen (TYLENOL) 325 MG tablet Per bottle as needed for pain.       Marland Kitchen aspirin 81 MG tablet Take 81 mg by mouth daily.        Marland Kitchen CALCIUM-VITAMIN D PO Take 1 tablet by mouth daily.        . Cholecalciferol (VITAMIN D) 1000 UNITS capsule Take 1,000 Units by mouth daily.        Marland Kitchen co-enzyme Q-10 30 MG capsule Take 30 mg by mouth daily.        . COMBIVENT 18-103 MCG/ACT inhaler USE 2 PUFFS EVERY 3 HOURSAS NEEDED FOR SHORTNESSOF BREATH, COUGH, WHEEZE,OR CONGESTION.  14.7 each  11  . dextromethorphan-guaiFENesin (MUCINEX DM) 30-600 MG per 12 hr tablet Take 1 tablet by mouth every 12 (twelve) hours as needed.        . famotidine (PEPCID) 20 MG tablet Take 20 mg by mouth at bedtime.        . gabapentin (NEURONTIN) 100 MG capsule Take 100 mg by mouth daily.        . hydrochlorothiazide (MICROZIDE) 12.5 MG capsule Take 1 capsule (12.5 mg total) by mouth daily.  30 capsule  1  . LORazepam (ATIVAN) 0.5 MG tablet 1-2 tabs by mouth at bedtime as needed for anxiety.       Marland Kitchen MAGNESIUM CITRATE PO Take 1 tablet by mouth 2 (two) times daily.        . Melatonin 3 MG TABS Take 1 tablet by mouth at bedtime as needed.       . metoprolol (LOPRESSOR) 50 MG tablet Take 1/2 tab by mouth twice daily.      . Multiple Vitamins-Minerals (CENTRUM SILVER PO) Take 1 tablet by mouth daily.        . nitroGLYCERIN (NITROSTAT) 0.4 MG SL tablet Place 0.4 mg under the tongue every 5 (five) minutes as needed. May repeat x3.       . olmesartan (BENICAR) 20 MG tablet Take 1 tablet (20 mg total) by mouth daily. Take  1/2 tab by mouth once daily.  30 tablet  0  . omeprazole (PRILOSEC) 20 MG capsule Take 1 capsule (20 mg total) by mouth daily.  30 capsule  6  . sertraline (ZOLOFT) 50 MG tablet Take 50 mg by mouth daily.        . SYMBICORT 160-4.5 MCG/ACT inhaler USE 2 PUFFS EVERY MORNINGAND EVENING  1 Inhaler  11    No Known Allergies  Past Medical History  Diagnosis Date  . Coronary artery disease   . Systolic dysfunction, left ventricle   . Hypertension   . COPD (chronic obstructive pulmonary disease)   . Hyperlipidemia   . Prostate cancer     Past Surgical History  Procedure Date  . Coronary artery bypass graft 1996  . Colonoscopy w/ polypectomy 2002  . Cardiac catheterization 2002    History  Smoking status  . Former Smoker -- 10 years  . Types: Pipe  . Quit date: 04/18/1981  Smokeless tobacco  . Never Used    History  Alcohol Use  . Yes    1 vodka daily    History reviewed. No pertinent family history.  Reviw of Systems:  Reviewed in the HPI.  All other systems are negative.  Physical Exam: BP 132/78  Pulse 76  Ht 5\' 7"  (1.702 m)  Wt 157 lb 12.8 oz (71.578 kg)  BMI 24.72 kg/m2 The patient is alert and oriented x 3.  The mood and affect are normal.   Skin: warm and dry.  Color is normal.    HEENT:   Normocephalic/atraumatic.  Lungs: His lungs are clear.   Heart: Heart regular rate S1-S2.    Abdomen: Good bowel sounds. His abdomen is soft and nontender to  Extremities:  No clubbing cyanosis or edema.  Neuro:  Exam is nonfocal. His gait is somewhat slow but is otherwise unremarkable.    ECG: EKG reveals normal sinus rhythm. He has no ST or T wave changes.  Assessment / Plan:

## 2011-10-28 NOTE — Patient Instructions (Addendum)
Your physician wants you to follow-up in: 3 MONTHS You will receive a reminder letter in the mail two months in advance. If you don't receive a letter, please call our office to schedule the follow-up appointment.   Your physician has recommended you make the following change in your medication:   Stop  HCTZ = HYDROCHLOROTHIAZIDE DUE TO HYPOTENSION

## 2011-10-28 NOTE — Assessment & Plan Note (Signed)
He's doing well from a cardiac standpoint. He's not had any episodes of chest pain.

## 2011-11-01 ENCOUNTER — Encounter (HOSPITAL_COMMUNITY)
Admission: RE | Admit: 2011-11-01 | Discharge: 2011-11-01 | Disposition: A | Discharge status: Home / Self Care | Payer: Medicare Other | Source: Ambulatory Visit | Attending: Pulmonary Disease | Admitting: Pulmonary Disease

## 2011-11-03 ENCOUNTER — Encounter (HOSPITAL_COMMUNITY): Payer: Medicare Other

## 2011-11-08 ENCOUNTER — Encounter (HOSPITAL_COMMUNITY)
Admission: RE | Admit: 2011-11-08 | Discharge: 2011-11-08 | Disposition: A | Discharge status: Home / Self Care | Payer: Medicare Other | Source: Ambulatory Visit | Attending: Pulmonary Disease | Admitting: Pulmonary Disease

## 2011-11-10 ENCOUNTER — Encounter (HOSPITAL_COMMUNITY)
Admission: RE | Admit: 2011-11-10 | Discharge: 2011-11-10 | Disposition: A | Discharge status: Home / Self Care | Payer: Medicare Other | Source: Ambulatory Visit | Attending: Pulmonary Disease | Admitting: Pulmonary Disease

## 2011-11-15 ENCOUNTER — Encounter (HOSPITAL_COMMUNITY)
Admission: RE | Admit: 2011-11-15 | Discharge: 2011-11-15 | Disposition: A | Discharge status: Home / Self Care | Payer: Medicare Other | Source: Ambulatory Visit | Attending: Pulmonary Disease | Admitting: Pulmonary Disease

## 2011-11-15 DIAGNOSIS — J449 Chronic obstructive pulmonary disease, unspecified: Secondary | ICD-10-CM | POA: Insufficient documentation

## 2011-11-15 DIAGNOSIS — R059 Cough, unspecified: Secondary | ICD-10-CM | POA: Insufficient documentation

## 2011-11-15 DIAGNOSIS — K219 Gastro-esophageal reflux disease without esophagitis: Secondary | ICD-10-CM | POA: Insufficient documentation

## 2011-11-15 DIAGNOSIS — R05 Cough: Secondary | ICD-10-CM | POA: Insufficient documentation

## 2011-11-15 DIAGNOSIS — E785 Hyperlipidemia, unspecified: Secondary | ICD-10-CM | POA: Insufficient documentation

## 2011-11-15 DIAGNOSIS — J4489 Other specified chronic obstructive pulmonary disease: Secondary | ICD-10-CM | POA: Insufficient documentation

## 2011-11-15 DIAGNOSIS — I2510 Atherosclerotic heart disease of native coronary artery without angina pectoris: Secondary | ICD-10-CM | POA: Insufficient documentation

## 2011-11-15 DIAGNOSIS — Z5189 Encounter for other specified aftercare: Secondary | ICD-10-CM | POA: Insufficient documentation

## 2011-11-15 DIAGNOSIS — I10 Essential (primary) hypertension: Secondary | ICD-10-CM | POA: Insufficient documentation

## 2011-11-17 ENCOUNTER — Encounter (HOSPITAL_COMMUNITY)
Admission: RE | Admit: 2011-11-17 | Discharge: 2011-11-17 | Disposition: A | Discharge status: Home / Self Care | Payer: Medicare Other | Source: Ambulatory Visit | Attending: Pulmonary Disease | Admitting: Pulmonary Disease

## 2011-11-22 ENCOUNTER — Encounter (HOSPITAL_COMMUNITY)
Admission: RE | Admit: 2011-11-22 | Discharge: 2011-11-22 | Disposition: A | Discharge status: Home / Self Care | Payer: Medicare Other | Source: Ambulatory Visit | Attending: Pulmonary Disease | Admitting: Pulmonary Disease

## 2011-11-24 ENCOUNTER — Encounter (HOSPITAL_COMMUNITY)
Admission: RE | Admit: 2011-11-24 | Discharge: 2011-11-24 | Disposition: A | Discharge status: Home / Self Care | Payer: Medicare Other | Source: Ambulatory Visit | Attending: Pulmonary Disease | Admitting: Pulmonary Disease

## 2011-11-29 ENCOUNTER — Encounter (HOSPITAL_COMMUNITY)
Admission: RE | Admit: 2011-11-29 | Discharge: 2011-11-29 | Disposition: A | Discharge status: Home / Self Care | Payer: Medicare Other | Source: Ambulatory Visit | Attending: Pulmonary Disease | Admitting: Pulmonary Disease

## 2011-11-30 ENCOUNTER — Encounter: Payer: Self-pay | Admitting: Internal Medicine

## 2011-11-30 ENCOUNTER — Ambulatory Visit (INDEPENDENT_AMBULATORY_CARE_PROVIDER_SITE_OTHER): Payer: Medicare Other | Admitting: Internal Medicine

## 2011-11-30 VITALS — BP 132/70 | HR 75 | Temp 98.0°F | Ht 68.0 in | Wt 159.6 lb

## 2011-11-30 DIAGNOSIS — J449 Chronic obstructive pulmonary disease, unspecified: Secondary | ICD-10-CM

## 2011-11-30 NOTE — Patient Instructions (Signed)

## 2011-11-30 NOTE — Assessment & Plan Note (Signed)
GOLD II relatively well compensated but needs combivent daytime more than ideal ? A candidate for Tudorza, then new bid anticholinergic when available since can't tolerate spiriva.   The proper method of use, as well as anticipated side effects, of this metered-dose inhaler are discussed and demonstrated to the patient. Improved to 90% with coaching    Each maintenance medication was reviewed in detail including most importantly the difference between maintenance and as needed and under what circumstances the prns are to be used.  This was done in the context of a medication calendar review which provided the patient with a user-friendly unambiguous mechanism for medication administration and reconciliation and provides an action plan for all active problems. It is critical that this be shown to every doctor  for modification during the office visit if necessary so the patient can use it as a working document.

## 2011-11-30 NOTE — Progress Notes (Signed)
Subjective:    Patient ID: Cristian [name], male    DOB: 1923/10/25, 77 y.o.   MRN: [id number]  HPI 88-yowm quit smoking age 36 with chronic cough and evidence of reversible air flow obstruction by PFTs who did not improve on Spiriva, but seemed much better in terms of dyspnea on Advair, but worse in terms of hoarseness.  Chart review did indicate that he had egd with large hiatal hernia with esophageal stricture and GERD and Dr. Jarold Motto recommended that he maintain on Nexium beforeakfast daily, which he had not been doing consistently so rec PPI  regularly and switched from Advair to Symbicort and returned 06/04/08 stating that his breathing was the best it's been in years and his hoarseness  also much better.    07/18/2011 ov Gradual ? Flare Sick visit Dr. Sherene Sires pt: Pt c/o increased SOB with exertion x 1 week worse x last few days, productive cough with clear mucus, wheezing Prior flares seem to have responded to ABx and/ or prednisone. rec Ambulatory sats on RA Prednisone 10 mg x 1 week Get CXR report from dr Jacky Kindle We will send in referral to an exercise program - this may take a few weeks to come through    08/17/2011 f/u ov/Marlenne Ridge cc weak, can walk a block slowly s stopping, no cough,  Using combivent sev x daily Sleeping ok without nocturnal  or early am exac of resp c/o's or need for noct saba.  >>overnight ox done > ok   10/03/2011 Follow up and Med review   Returns for follow up and med review. We reveiwed all his meds and organized his med calendar with pt education .  He has several bottles with no labels on the bottle or written in on tape. Advised on proper labeling. Doing well without flare of cough or dyspnea.    rec Follow med calendar closely and bring to each visit.     11/30/2011 f/u ov/Leiby Pigeon no med calendar cc doe x > slow walk, using combivent  2-3 x daily but never at night. No purulent sputum.  Sleeping ok without nocturnal  or early am exacerbation  of  respiratory  c/o's or need for noct saba. Also denies any obvious fluctuation of symptoms with weather or environmental changes or other aggravating or alleviating factors except as outlined above   ROS  At present neg for  any significant sore throat, dysphagia, itching, sneezing,  nasal congestion or excess/ purulent secretions,  fever, chills, sweats, unintended wt loss, pleuritic or exertional cp, hempoptysis, orthopnea pnd or leg swelling.  Also denies presyncope, palpitations, heartburn, abdominal pain, nausea, vomiting, diarrhea  or change in bowel or urinary habits, dysuria,hematuria,  rash, arthralgias, visual complaints, headache, numbness weakness or ataxia.     Past Medical History:  GERD (ICD-530.81)...................................................................Marland KitchenJarold Motto  - See positive endoscopy report 05/03/2006 showing esophageal stricture and hiatal hernia  CAD (ICD-414.00)  HYPERTENSION (ICD-401.9)  HYPERLIPIDEMIA (ICD-272.4)  COUGH, CHRONIC (ICD-786.2)  - Sinus CT neg March 02, 2010  COPD (ICD-496)..........................................................................Marland KitchenWert  - PFTs 06/04/08 FEV1 51% ratio 36% diffusing capacity 74% 12% response to bronchodilator  - HFA 75% January 28, 2010 > 75% January 07, 2011  Complex med regimen 10/03/2011               Objective:   amb wm nad seems more weak than sob, trouble getting up on exam table, one person assist Wt 167 08/17/2011 >>166 10/03/2011 > 11/30/2011  159 HEENT mild turbinate edema.  Oropharynx no thrush  or excess pnd or cobblestoning.  No JVD or cervical adenopathy. Mild accessory muscle hypertrophy. Trachea midline, nl thryroid. Chest was hyperinflated by percussion with diminished breath sounds Regular rate and rhythm without murmur gallop or rub or increase P2 or edema.  Abd: no hsm, nl excursion. Ext warm without cyanosis or clubbing.        Assessment & Plan:

## 2011-12-01 ENCOUNTER — Encounter (HOSPITAL_COMMUNITY): Payer: Medicare Other

## 2011-12-06 ENCOUNTER — Encounter (HOSPITAL_COMMUNITY): Payer: Medicare Other

## 2011-12-14 NOTE — Progress Notes (Signed)
Pulmonary Rehabilitation Program Progress Report   Orientation:  08/18/2011 Graduate Date:  11/29/2011 Discharge Date:  11/29/2011  # of sessions completed: 25  Pulmonologist: Vassie Loll Family MD:  Kathleen Lime Time:  1:30pm   A.  Exercise Program:  Tolerates exercise @ 1.84 METS for 45 minutes, Walk Test Results:  Pre: 662 and Post: 570, Decreased functional capacity  13.90 %, No Change  muscular strength  0 %, Decreased dyspnea score 24.32 %, Improved education score 16.67 %, Exercise limited by dyspnea and Needs encouragement on exercise program  B.  Mental Health:  Good mental attitude and Quality of Life (QOL)  changes:  Overall  +17.53 %, Health/Functioning +30.72 %, Socioeconomics 5.18 %, Psych/Spiritual 20.69 %, Family -0.78 %    C.  Education/Instruction/Skills  Uses Perceived Exertion Scale and/or Dyspnea Scale and Attended 10 education classes  Demonstrates accurate pursed lip breathing   Home Exercise Given: 09/06/2011  D.  Nutrition/Weight Control/Body Composition:  Adherence to prescribed nutrition program: good , % Body Fat  25.2 and Patient weight change: -1.2kg  *This section completed by Mickle Plumb, Andres Shad, RD, LDN, CDE  E.  Blood Lipids    Lab Results  Component Value Date   CHOL 173 04/19/2011     Lab Results  Component Value Date   TRIG 98.0 04/19/2011     Lab Results  Component Value Date   HDL 71.40 04/19/2011     Lab Results  Component Value Date   CHOLHDL 2 04/19/2011     No results found for this basename: LDLDIRECT      F.  Lifestyle Changes:  Making positive lifestyle changes  G.  Symptoms noted with exercise:  Shortness of breath and a little more fatigue    Report Completed By:  Brock Ra L   Comments: Patient tolerated well on exercise equipment. Vital signs stable throughout program. Patient had no complaints and satisfied with the final outcome. Patient plans to join silver sneakers at the [facility].  Thank you for the referral.

## 2012-01-05 ENCOUNTER — Other Ambulatory Visit: Payer: Self-pay | Admitting: Adult Health

## 2012-01-06 NOTE — Progress Notes (Signed)
Pulmonary Rehabilitation Program Progress Report   Orientation:  08/18/2011 Graduate Date:  11/29/2011 Discharge Date:  11/29/2011  # of sessions completed: 25  Pulmonologist: Vassie Loll Family MD:  Kathleen Lime Time:  1:30pm   A.  Exercise Program:  Tolerates exercise @ 1.84 METS for 45 minutes, Walk Test Results:  Pre: 662 and Post: 570, Decreased functional capacity  13.90 %, No Change  muscular strength  0 %, Decreased dyspnea score 24.32 %, Improved education score 16.67 %, Exercise limited by dyspnea and Needs encouragement on exercise program  B.  Mental Health:  Good mental attitude and Quality of Life (QOL)  changes:  Overall  +17.53 %, Health/Functioning +30.72 %, Socioeconomics 5.18 %, Psych/Spiritual 20.69 %, Family -0.78 %    C.  Education/Instruction/Skills  Uses Perceived Exertion Scale and/or Dyspnea Scale and Attended 10 education classes  Demonstrates accurate pursed lip breathing   Home Exercise Given: 09/06/2011  D.  Nutrition/Weight Control/Body Composition:  Adherence to prescribed nutrition program: good , % Body Fat  25.2 and Patient weight change: -4 kg.  *This section completed by Mickle Plumb, Andres Shad, RD, LDN, CDE  E.  Blood Lipids    Lab Results  Component Value Date   CHOL 173 04/19/2011   HDL 71.40 04/19/2011   LDLCALC 82 04/19/2011   TRIG 98.0 04/19/2011   CHOLHDL 2 04/19/2011  *This section completed by Mickle Plumb, Andres Shad, RD, LDN, CDE  F.  Lifestyle Changes:  Making positive lifestyle changes  G.  Symptoms noted with exercise:  Shortness of breath and a little more fatigue    Report Completed By: Ruffin Frederick, EP   Comments: Patient tolerated well on exercise equipment. Vital signs stable throughout program. Patient had no complaints and satisfied with the final outcome. Patient plans to join silver sneakers at the [facility]. Thank you for the referral.

## 2012-01-31 ENCOUNTER — Encounter: Payer: Self-pay | Admitting: Cardiovascular Disease

## 2012-01-31 ENCOUNTER — Ambulatory Visit (INDEPENDENT_AMBULATORY_CARE_PROVIDER_SITE_OTHER): Payer: Medicare Other | Admitting: Cardiovascular Disease

## 2012-01-31 VITALS — BP 120/82 | HR 80 | Ht 67.0 in | Wt 152.1 lb

## 2012-01-31 DIAGNOSIS — E785 Hyperlipidemia, unspecified: Secondary | ICD-10-CM

## 2012-01-31 DIAGNOSIS — I2510 Atherosclerotic heart disease of native coronary artery without angina pectoris: Secondary | ICD-10-CM

## 2012-01-31 DIAGNOSIS — I10 Essential (primary) hypertension: Secondary | ICD-10-CM

## 2012-01-31 NOTE — Assessment & Plan Note (Signed)
Cristian [name] is [name].  No angina

## 2012-01-31 NOTE — Progress Notes (Signed)
Cristian [name] Date of Birth  04-28-1923 [facility]     [place] [facility]  [address]    [address]   [address] [place], [place]  [address]    [place], [place]  [address] [phone]  Fax  [phone]  [phone]  Fax [phone]  Problem List: 1. CAD - CABG 2. COPD 3. HTN 4. Prostate Cancer 5. Hyperlipidemia.  History of Present Illness: Cristian [name] is an 78 y.o. gentleman with the above noted hx.  He denies any cardiac issues.  He has done well since his last visit.  He has lost about 10 lbs since his last visit.  He feels well.   Current Outpatient Prescriptions on File Prior to Visit  Medication Sig Dispense Refill  . acetaminophen (TYLENOL) 325 MG tablet Per bottle as needed for pain.       Marland Kitchen aspirin 81 MG tablet Take 81 mg by mouth daily.        Marland Kitchen CALCIUM-VITAMIN D PO Take 1 tablet by mouth daily.        . Cholecalciferol (VITAMIN D) 1000 UNITS capsule Take 1,000 Units by mouth daily.        Marland Kitchen co-enzyme Q-10 30 MG capsule Take 30 mg by mouth daily.        . COMBIVENT 18-103 MCG/ACT inhaler USE 2 PUFFS EVERY 3 HOURS AS NEEDED FOR SHORTNESS OF BREATH, COUGH, WHEEZE, OR CONGESTION.  14.7 each  2  . dextromethorphan-guaiFENesin (MUCINEX DM) 30-600 MG per 12 hr tablet Take 1 tablet by mouth every 12 (twelve) hours as needed.        . famotidine (PEPCID) 20 MG tablet Take 20 mg by mouth at bedtime.        . gabapentin (NEURONTIN) 100 MG capsule Take 100 mg by mouth daily.        Marland Kitchen LORazepam (ATIVAN) 0.5 MG tablet 1-2 tabs by mouth at bedtime as needed for anxiety.       Marland Kitchen MAGNESIUM CITRATE PO Take 1 tablet by mouth 2 (two) times daily.        . Melatonin 3 MG TABS Take 1 tablet by mouth at bedtime as needed.       . metoprolol (LOPRESSOR) 50 MG tablet Take 1/2 tab by mouth twice daily.      . Multiple Vitamins-Minerals (CENTRUM SILVER PO) Take 1 tablet by mouth daily.        . nitroGLYCERIN (NITROSTAT) 0.4 MG SL tablet Place 0.4 mg under the tongue every 5  (five) minutes as needed. May repeat x3.       . olmesartan (BENICAR) 20 MG tablet Take 0.5 tablets (10 mg total) by mouth daily. Take 1/2 tab by mouth once daily.  30 tablet  5  . omeprazole (PRILOSEC) 20 MG capsule Take 1 capsule (20 mg total) by mouth daily.  30 capsule  6  . sertraline (ZOLOFT) 50 MG tablet Take 50 mg by mouth daily.        . SYMBICORT 160-4.5 MCG/ACT inhaler USE 2 PUFFS EVERY MORNINGAND EVENING  1 Inhaler  11    No Known Allergies  Past Medical History  Diagnosis Date  . Coronary artery disease   . Systolic dysfunction, left ventricle   . Hypertension   . COPD (chronic obstructive pulmonary disease)   . Hyperlipidemia   . Prostate cancer     Past Surgical History  Procedure Date  . Coronary artery bypass graft 1996  . Colonoscopy w/ polypectomy 2002  .  Cardiac catheterization 2002    History  Smoking status  . Former Smoker -- 10 years  . Types: Pipe  . Quit date: 04/18/1981  Smokeless tobacco  . Never Used    History  Alcohol Use  . Yes    1 vodka daily    No family history on file.  Reviw of Systems:  Reviewed in the HPI.  All other systems are negative.  Physical Exam: Blood pressure 176/97, pulse 80, height 5\' 7"  (1.702 m), weight 152 lb 1.9 oz (69.001 kg). General: Well developed, well nourished, in no acute distress.  Head: Normocephalic, atraumatic, sclera non-icteric, mucus membranes are moist,   Neck: Supple. Negative for carotid bruits. JVD not elevated.  Lungs: Clear bilaterally to auscultation without wheezes, rales, or rhonchi. Breathing is unlabored.  Heart: RRR with S1 S2. No murmurs, rubs, or gallops appreciated.  Abdomen: Soft, non-tender, non-distended with normoactive bowel sounds. No hepatomegaly. No rebound/guarding. No obvious abdominal masses.  Msk:  Strength and tone appear normal for age.  Extremities: No clubbing or cyanosis. No edema.  Distal pedal pulses are 2+ and equal bilaterally.  Neuro: Alert and  oriented X 3. Moves all extremities spontaneously.  Psych:  Responds to questions appropriately with a normal affect.  ECG:  Assessment / Plan:

## 2012-01-31 NOTE — Assessment & Plan Note (Signed)
His BP has been OK.  It was a bit elevated when he first arrived at the office and then was normal when we re checked.  Continue to monitor.

## 2012-01-31 NOTE — Patient Instructions (Signed)
Your physician recommends that you schedule a follow-up appointment in: 6 months  Your physician recommends that you return for lab work in: 6 months   

## 2012-02-13 ENCOUNTER — Other Ambulatory Visit: Payer: Self-pay | Admitting: *Deleted

## 2012-02-13 MED ORDER — METOPROLOL TARTRATE 50 MG PO TABS: 12.5000 mg | ORAL_TABLET | Freq: Two times a day (BID) | ORAL | Status: DC

## 2012-02-28 ENCOUNTER — Ambulatory Visit (INDEPENDENT_AMBULATORY_CARE_PROVIDER_SITE_OTHER): Payer: Medicare Other | Admitting: Internal Medicine

## 2012-02-28 ENCOUNTER — Encounter: Payer: Self-pay | Admitting: Internal Medicine

## 2012-02-28 VITALS — BP 104/80 | HR 80 | Temp 97.5°F | Ht 67.0 in | Wt 152.0 lb

## 2012-02-28 DIAGNOSIS — J449 Chronic obstructive pulmonary disease, unspecified: Secondary | ICD-10-CM

## 2012-02-28 NOTE — Assessment & Plan Note (Addendum)
-   PFTs 06/04/08 FEV1 51% ratio 36% diffusing capacity 74% 12% response to bronchodilator  - HFA 90% 11/30/2011  - ONO RA on 08/22/11  GOLD II with minimal symptoms and no exacerbations on present rx    Each maintenance medication was reviewed in detail including most importantly the difference between maintenance and as needed and under what circumstances the prns are to be used. This was done in the context of a medication calendar review which provided the patient with a user-friendly unambiguous mechanism for medication administration and reconciliation and provides an action plan for all active problems. It is critical that this be shown to every doctor  for modification during the office visit if necessary so the patient can use it as a working document.

## 2012-02-28 NOTE — Progress Notes (Signed)
Subjective:    Patient ID: Cristian [name], male    DOB: 03/08/1923    MRN: [id number]  Brief patient profile:  78-yowm quit smoking age 78 with chronic cough and evidence of reversible air flow obstruction by PFTs who did not improve on Spiriva, but seemed much better in terms of dyspnea on Advair, but worse in terms of hoarseness.  Chart review did indicate that he had egd with large hiatal hernia with esophageal stricture and GERD and Dr. Jarold Motto recommended that he maintain on Nexium beforeakfast daily, which he had not been doing consistently so rec PPI  regularly and switched from Advair to Symbicort and returned 06/04/08 stating that his breathing was the best it's been in years and his hoarseness  also much better.   HPI 07/18/2011 ov Gradual ? Flare Sick visit Dr. Sherene Sires pt: Pt c/o increased SOB with exertion x 1 week worse x last few days, productive cough with clear mucus, wheezing Prior flares seem to have responded to ABx and/ or prednisone. rec Ambulatory sats on RA Prednisone 10 mg x 1 week Get CXR report from dr Jacky Kindle We will send in referral to an exercise program - this may take a few weeks to come through    08/17/2011 f/u ov/Madisun Hargrove cc weak, can walk a block slowly s stopping, no cough,  Using combivent sev x daily Sleeping ok without nocturnal  or early am exac of resp c/o's or need for noct saba.  >>overnight ox done > ok   10/03/2011 Follow up and Med review   Returns for follow up and med review. We reveiwed all his meds and organized his med calendar with pt education .  He has several bottles with no labels on the bottle or written in on tape. Advised on proper labeling. Doing well without flare of cough or dyspnea.    rec Follow med calendar closely and bring to each visit.     11/30/2011 f/u ov/Nada Godley no med calendar cc doe x > slow walk, using combivent  2-3 x daily but never at night. No purulent sputum. rec No change rx, follow med calendar,  reviewed   02/28/2012 f/u ov/R[place] Berkley cc not limited by breathing using symbicort 160 2 bid and combivent 2-3 x per day, more limited by hip than breathing. No cough.  Sleeping ok without nocturnal  or early am exacerbation  of respiratory  c/o's or need for noct saba. Also denies any obvious fluctuation of symptoms with weather or environmental changes or other aggravating or alleviating factors except as outlined above   ROS  At present neg for  any significant sore throat, dysphagia, itching, sneezing,  nasal congestion or excess/ purulent secretions,  fever, chills, sweats, unintended wt loss, pleuritic or exertional cp, hempoptysis, orthopnea pnd or leg swelling.  Also denies presyncope, palpitations, heartburn, abdominal pain, nausea, vomiting, diarrhea  or change in bowel or urinary habits, dysuria,hematuria,  rash,  visual complaints, headache, numbness weakness or ataxia.     Past Medical History:  GERD (ICD-530.81)...................................................................Marland KitchenJarold Motto  - See positive endoscopy report 05/03/2006 showing esophageal stricture and hiatal hernia  CAD (ICD-414.00)  HYPERTENSION (ICD-401.9)  HYPERLIPIDEMIA (ICD-272.4)  COUGH, CHRONIC (ICD-786.2)  - Sinus CT neg March 02, 2010  COPD (ICD-496)..........................................................................Marland KitchenWert  - PFTs 06/04/08 FEV1 51% ratio 36% diffusing capacity 74% 12% response to bronchodilator  - HFA 75% January 28, 2010 > 75% January 07, 2011  Complex med regimen 10/03/2011         Objective:   amb  wm nad seems more weak than sob, trouble getting up on exam table, one person assist Wt 167 08/17/2011 >>166 10/03/2011 > 11/30/2011  159 > 02/28/2012  152 HEENT mild turbinate edema.  Oropharynx no thrush or excess pnd or cobblestoning.  No JVD or cervical adenopathy. Mild accessory muscle hypertrophy. Trachea midline, nl thryroid. Chest was hyperinflated by percussion with diminished breath  sounds Regular rate and rhythm without murmur gallop or rub or increase P2 or edema.  Abd: no hsm, nl excursion. Ext warm without cyanosis or clubbing.        Assessment & Plan:

## 2012-02-28 NOTE — Patient Instructions (Signed)
Only use your albuterol as a rescue medication(combivent)  to be used if you can't catch your breath by resting or doing a relaxed purse lip breathing pattern. The less you use it, the better it will work when you need it.   See calendar for specific medication instructions and bring it back for each and every office visit for every healthcare provider you see.  Without it,  you may not receive the best quality medical care that we feel you deserve.  You will note that the calendar groups together  your maintenance  medications that are timed at particular times of the day.  Think of this as your checklist for what your doctor has instructed you to do until your next evaluation to see what benefit  there is  to staying on a consistent group of medications intended to keep you well.  The other group at the bottom is entirely up to you to use as you see fit  for specific symptoms that may arise between visits that require you to treat them on an as needed basis.  Think of this as your action plan or "what if" list.   Separating the top medications from the bottom group is fundamental to providing you adequate care going forward.    Please schedule a follow up office visit in 6 months, call sooner if needed

## 2012-03-06 ENCOUNTER — Other Ambulatory Visit: Payer: Self-pay | Admitting: Adult Health

## 2012-03-06 MED ORDER — IPRATROPIUM-ALBUTEROL 18-103 MCG/ACT IN AERO: 2.0000 | INHALATION_SPRAY | RESPIRATORY_TRACT | Status: DC | PRN

## 2012-03-08 ENCOUNTER — Other Ambulatory Visit: Payer: Self-pay | Admitting: Neurosurgery

## 2012-03-08 DIAGNOSIS — M549 Dorsalgia, unspecified: Secondary | ICD-10-CM

## 2012-03-08 DIAGNOSIS — M79606 Pain in leg, unspecified: Secondary | ICD-10-CM

## 2012-03-12 ENCOUNTER — Ambulatory Visit
Admission: RE | Admit: 2012-03-12 | Discharge: 2012-03-12 | Disposition: A | Discharge status: Home / Self Care | Payer: Medicare Other | Source: Ambulatory Visit | Attending: Neurosurgery | Admitting: Neurosurgery

## 2012-03-12 DIAGNOSIS — M79606 Pain in leg, unspecified: Secondary | ICD-10-CM

## 2012-03-12 DIAGNOSIS — M549 Dorsalgia, unspecified: Secondary | ICD-10-CM

## 2012-03-12 MED ORDER — IOHEXOL 180 MG/ML  SOLN
1.0000 mL | Freq: Once | INTRAMUSCULAR | Status: AC | PRN
  Administered 2012-03-12: 1 mL via EPIDURAL

## 2012-03-12 MED ORDER — METHYLPREDNISOLONE ACETATE 40 MG/ML INJ SUSP (RADIOLOG
120.0000 mg | Freq: Once | INTRAMUSCULAR | Status: AC
  Administered 2012-03-12: 120 mg via EPIDURAL

## 2012-03-12 NOTE — Discharge Instructions (Signed)

## 2012-03-31 ENCOUNTER — Other Ambulatory Visit: Payer: Self-pay | Admitting: Adult Health

## 2012-04-05 ENCOUNTER — Other Ambulatory Visit: Payer: Self-pay | Admitting: *Deleted

## 2012-04-05 MED ORDER — NITROGLYCERIN 0.4 MG SL SUBL: 0.4000 mg | SUBLINGUAL_TABLET | SUBLINGUAL | Status: AC | PRN

## 2012-05-10 ENCOUNTER — Other Ambulatory Visit: Payer: Self-pay | Admitting: *Deleted

## 2012-05-10 DIAGNOSIS — K219 Gastro-esophageal reflux disease without esophagitis: Secondary | ICD-10-CM

## 2012-05-10 MED ORDER — OMEPRAZOLE 20 MG PO CPDR: 20.0000 mg | DELAYED_RELEASE_CAPSULE | Freq: Every day | ORAL | Status: DC

## 2012-06-05 ENCOUNTER — Telehealth: Payer: Self-pay | Admitting: Internal Medicine

## 2012-06-05 MED ORDER — IPRATROPIUM-ALBUTEROL 20-100 MCG/ACT IN AERS: 1.0000 | INHALATION_SPRAY | Freq: Four times a day (QID) | RESPIRATORY_TRACT | Status: DC

## 2012-06-05 NOTE — Telephone Encounter (Signed)
Rx sent 

## 2012-06-05 NOTE — Telephone Encounter (Signed)
[place] requesting rx change from combivent to  combivent respimat 20 mcg/176m Dr wert please advise Thank you  No Known Allergies

## 2012-06-05 NOTE — Telephone Encounter (Signed)
Ok but they'll need to show him how it works and if he has problems with it will need ov with the new inhaler in hand.  The dose is one puff qid (not two)

## 2012-06-19 ENCOUNTER — Telehealth: Payer: Self-pay | Admitting: Internal Medicine

## 2012-06-19 ENCOUNTER — Telehealth: Payer: Self-pay | Admitting: Cardiovascular Disease

## 2012-06-19 DIAGNOSIS — J449 Chronic obstructive pulmonary disease, unspecified: Secondary | ICD-10-CM

## 2012-06-19 NOTE — Telephone Encounter (Signed)
Pt states that his insurance will no longer cover His o2 needs thru  [facility]. Pt states that [facility]  Is on his insurance list. Dr Vassie Loll

## 2012-06-19 NOTE — Telephone Encounter (Signed)
Pt told to call Dr Wertz/ pulmonologist to order o2/ COPD, pt agreed to plan

## 2012-06-19 NOTE — Telephone Encounter (Signed)
Spoke withpt who says he got a letter from [employer], stating that [facility] no longer taking medicare, and only dme co in [place] that is taking Medicare is [facility], and [facility]. Pt says he has been happy with [facility], i spoke with [facility] liasion about this letter, who says they have not stopped taking Medicare, and letter being sent out is misleading due to [employer]. [facility] will call pt in ref to this phone number given to [place] who says [facility] will call patient. Kandice Hams

## 2012-06-19 NOTE — Telephone Encounter (Signed)
Please return call to patient at [phone] regarding oxygen prescription for new medicare equipment provider

## 2012-06-26 ENCOUNTER — Telehealth: Payer: Self-pay | Admitting: Internal Medicine

## 2012-06-26 NOTE — Telephone Encounter (Signed)
The new combivent (which looks nothing like the old one) is one every 6 hours if needed

## 2012-06-26 NOTE — Telephone Encounter (Signed)
Phone call from patient, patient was confused on Combivent instructions; Explained to pt that per MW he was to take  1 puff q4hrs. Pt was having trouble understanding because the Rx he received had 1 puff q6hrs written on it. Verlon Au spoke with pt and verified with the patient the instructions to his inhaler and the pt then stated that he was wondering what to use for his rescue inhaler. Patient aware that MW out of office until tomorrow morning but that someone will call him back tomorrow morning(06/27/12) in regards to this msg.   What is the patients new rescue inhaler now that he is no longer using combivent as his rescue? Please Advise Dr Sherene Sires, Thanks.

## 2012-06-27 NOTE — Telephone Encounter (Signed)
Called spoke with patient, advised the combivent respimat is his new rescue inhaler to be used every 6 hours when needed.  Pt asked what he is to do if he needs it more often than every 6 hours > advised that if he is in a flare and needing his combivent respimat more than every 6 hours, he is to call the office for recommendations regarding his flare.  Pt okay with these recs and verbalized his understanding.  Nothing further needed; will sign off.

## 2012-08-06 ENCOUNTER — Ambulatory Visit: Payer: Medicare Other | Admitting: *Deleted

## 2012-08-27 ENCOUNTER — Ambulatory Visit: Payer: Medicare Other | Attending: Internal Medicine | Admitting: Physical Therapy

## 2012-08-27 DIAGNOSIS — R269 Unspecified abnormalities of gait and mobility: Secondary | ICD-10-CM | POA: Insufficient documentation

## 2012-08-27 DIAGNOSIS — IMO0001 Reserved for inherently not codable concepts without codable children: Secondary | ICD-10-CM | POA: Insufficient documentation

## 2012-09-06 ENCOUNTER — Ambulatory Visit: Payer: Medicare Other | Admitting: Physical Therapy

## 2012-09-10 ENCOUNTER — Ambulatory Visit: Payer: Medicare Other | Admitting: Physical Therapy

## 2012-09-11 ENCOUNTER — Ambulatory Visit (INDEPENDENT_AMBULATORY_CARE_PROVIDER_SITE_OTHER): Payer: Medicare Other | Admitting: Internal Medicine

## 2012-09-11 ENCOUNTER — Encounter: Payer: Self-pay | Admitting: Internal Medicine

## 2012-09-11 VITALS — BP 142/88 | HR 67 | Temp 98.0°F | Ht 68.0 in | Wt 148.4 lb

## 2012-09-11 DIAGNOSIS — J4489 Other specified chronic obstructive pulmonary disease: Secondary | ICD-10-CM

## 2012-09-11 DIAGNOSIS — J449 Chronic obstructive pulmonary disease, unspecified: Secondary | ICD-10-CM

## 2012-09-11 NOTE — Patient Instructions (Addendum)
See calendar for specific medication instructions and bring it back for each and every office visit for every healthcare provider you see.  Without it,  you may not receive the best quality medical care that we feel you deserve.  You will note that the calendar groups together  your maintenance  medications that are timed at particular times of the day.  Think of this as your checklist for what your doctor has instructed you to do until your next evaluation to see what benefit  there is  to staying on a consistent group of medications intended to keep you well.  The other group at the bottom is entirely up to you to use as you see fit  for specific symptoms that may arise between visits that require you to treat them on an as needed basis.  Think of this as your action plan or "what if" list.   Separating the top medications from the bottom group is fundamental to providing you adequate care going forward.     If you are satisfied with your treatment plan let your doctor know and he/she can either refill your medications or you can return here when your prescription runs out.     If in any way you are not 100% satisfied,  please tell us.  If 100% better, tell your friends!  

## 2012-09-11 NOTE — Progress Notes (Signed)
Subjective:    Patient ID: Cristian [name], male    DOB: 1923-09-10    MRN: [id number]  Brief patient profile:  88-yowm quit smoking age 78 with chronic cough and evidence of reversible air flow obstruction by PFTs who did not improve on Spiriva, but seemed much better in terms of dyspnea on Advair, but worse in terms of hoarseness.  Chart review did indicate that he had egd with large hiatal hernia with esophageal stricture and GERD and Dr. Jarold Motto recommended that he maintain on Nexium beforeakfast daily, which he had not been doing consistently so rec PPI  regularly and switched from Advair to Symbicort and returned 06/04/08 stating that his breathing was the best it's been in years and his hoarseness  also much better.   HPI 07/18/2011 ov Gradual ? Flare Sick visit Dr. Sherene Sires pt: Pt c/o increased SOB with exertion x 1 week worse x last few days, productive cough with clear mucus, wheezing Prior flares seem to have responded to ABx and/ or prednisone. rec Ambulatory sats on RA Prednisone 10 mg x 1 week Get CXR report from dr Jacky Kindle We will send in referral to an exercise program - this may take a few weeks to come through    08/17/2011 f/u ov/Tambi Thole cc weak, can walk a block slowly s stopping, no cough,  Using combivent sev x daily Sleeping ok without nocturnal  or early am exac of resp c/o's or need for noct saba.  >>overnight ox done > ok   02/28/2012 f/u ov/Jevaeh Shams cc not limited by breathing using symbicort 160 2 bid and combivent 2-3 x per day, more limited by hip than breathing. No cough. rec Only use your albuterol as a rescue medication(combivent)  to be used if you can't catch your breath by resting or doing a relaxed purse lip breathing pattern. The less you use it, the better it will work when you need it.  See calendar for specific medication instructions   Please schedule a follow up office visit in 6 months, call sooner if needed   09/11/2012 f/u ov/Melanye Hiraldo cc breathing better,  using combivent rescue "hardly at all" ,   No unusual cough, purulent sputum or sinus/hb symptoms on present rx. Doesn't like the new combivent, getting the only one from [place]   Sleeping ok without nocturnal  or early am exacerbation  of respiratory  c/o's or need for noct saba. Also denies any obvious fluctuation of symptoms with weather or environmental changes or other aggravating or alleviating factors except as outlined above   ROS  The following are not active complaints unless bolded sore throat, dysphagia, dental problems, itching, sneezing,  nasal congestion or excess/ purulent secretions, ear ache,   fever, chills, sweats, unintended wt loss, pleuritic or exertional cp, hemoptysis,  orthopnea pnd or leg swelling, presyncope, palpitations, heartburn, abdominal pain, anorexia, nausea, vomiting, diarrhea  or change in bowel or urinary habits, change in stools or urine, dysuria,hematuria,  rash, arthralgias, visual complaints, headache, numbness weakness or ataxia or problems with walking or coordination,  change in mood/affect or memory.        Past Medical History:  GERD (ICD-530.81)...................................................................Marland KitchenJarold Motto  - See positive endoscopy report 05/03/2006 showing esophageal stricture and hiatal hernia  CAD (ICD-414.00)  HYPERTENSION (ICD-401.9)  HYPERLIPIDEMIA (ICD-272.4)  COUGH, CHRONIC (ICD-786.2)  - Sinus CT neg March 02, 2010  COPD (ICD-496)..........................................................................Marland KitchenWert  - PFTs 06/04/08 FEV1 51% ratio 36% diffusing capacity 74% 12% response to bronchodilator  - HFA 75% January 28, 2010 >  75% January 07, 2011  Complex med regimen 10/03/2011         Objective:   amb wm nad seems more weak than sob, trouble getting up on exam table, one person assist Wt 167 08/17/2011 >>166 10/03/2011 > 11/30/2011  159 > 02/28/2012  152 > 09/11/2012  148 HEENT mild turbinate edema.  Oropharynx no  thrush or excess pnd or cobblestoning.  No JVD or cervical adenopathy. Mild accessory muscle hypertrophy. Trachea midline, nl thryroid. Chest was hyperinflated by percussion with diminished breath sounds Regular rate and rhythm without murmur gallop or rub or increase P2 or edema.  Abd: no hsm, nl excursion. Ext warm without cyanosis or clubbing.        Assessment & Plan:

## 2012-09-13 NOTE — Assessment & Plan Note (Signed)
-   PFTs 06/04/08 FEV1 51% ratio 36% diffusing capacity 74% 12% response to bronchodilator  - HFA 90% 11/30/2011  - ONO RA ok 08/22/11    GOLD III with significant AB component well compensated on symbicort 160  2 bid and prn combivent so pulmonary f/u can be prn  See instructions for specific recommendations which were reviewed directly with the patient who was given a copy with highlighter outlining the key components.

## 2012-09-17 ENCOUNTER — Ambulatory Visit: Payer: Medicare Other | Attending: Internal Medicine | Admitting: Physical Therapy

## 2012-09-17 DIAGNOSIS — IMO0001 Reserved for inherently not codable concepts without codable children: Secondary | ICD-10-CM | POA: Insufficient documentation

## 2012-09-17 DIAGNOSIS — R269 Unspecified abnormalities of gait and mobility: Secondary | ICD-10-CM | POA: Insufficient documentation

## 2012-09-20 ENCOUNTER — Ambulatory Visit (INDEPENDENT_AMBULATORY_CARE_PROVIDER_SITE_OTHER): Payer: Medicare Other

## 2012-09-20 DIAGNOSIS — Z23 Encounter for immunization: Secondary | ICD-10-CM

## 2012-09-21 DIAGNOSIS — Z23 Encounter for immunization: Secondary | ICD-10-CM

## 2012-09-24 ENCOUNTER — Ambulatory Visit: Payer: Medicare Other | Admitting: Physical Therapy

## 2012-10-01 ENCOUNTER — Ambulatory Visit: Payer: Medicare Other | Admitting: Physical Therapy

## 2012-10-11 ENCOUNTER — Ambulatory Visit: Payer: Medicare Other | Admitting: Physical Therapy

## 2012-10-15 ENCOUNTER — Ambulatory Visit: Payer: Medicare Other | Attending: Internal Medicine | Admitting: Physical Therapy

## 2012-10-15 DIAGNOSIS — R269 Unspecified abnormalities of gait and mobility: Secondary | ICD-10-CM | POA: Insufficient documentation

## 2012-10-15 DIAGNOSIS — IMO0001 Reserved for inherently not codable concepts without codable children: Secondary | ICD-10-CM | POA: Insufficient documentation

## 2012-10-23 ENCOUNTER — Ambulatory Visit: Payer: Medicare Other | Admitting: Physical Therapy

## 2012-11-01 ENCOUNTER — Other Ambulatory Visit: Payer: Self-pay | Admitting: Adult Health

## 2012-11-02 ENCOUNTER — Other Ambulatory Visit: Payer: Self-pay | Admitting: *Deleted

## 2012-11-02 MED ORDER — OLMESARTAN MEDOXOMIL 20 MG PO TABS: 10.0000 mg | ORAL_TABLET | Freq: Every day | ORAL | Status: DC

## 2012-11-02 NOTE — Telephone Encounter (Signed)
Pt needs appointment then refill can be made Fax Received. Refill Completed. Barnes Florek Chowoe (R.M.A)   

## 2013-03-11 ENCOUNTER — Other Ambulatory Visit: Payer: Self-pay | Admitting: *Deleted

## 2013-03-11 ENCOUNTER — Telehealth: Payer: Self-pay | Admitting: Internal Medicine

## 2013-03-11 MED ORDER — IPRATROPIUM-ALBUTEROL 20-100 MCG/ACT IN AERS: 1.0000 | INHALATION_SPRAY | Freq: Four times a day (QID) | RESPIRATORY_TRACT | Status: DC

## 2013-03-11 NOTE — Telephone Encounter (Signed)
ATC patient, no answer [id number] 

## 2013-03-11 NOTE — Telephone Encounter (Signed)
Spoke with pt  He is needing combivent refill I advised will send this time, but future refills can go through his PCP as long as he is doing well I advised that if PCP not okay with refilling meds, will need to see [place] at leaast once per yr so we can prescribe Pt verbalized understanding and states nothing further needed Rx was sent to pharm

## 2013-03-13 ENCOUNTER — Other Ambulatory Visit: Payer: Self-pay | Admitting: *Deleted

## 2013-03-13 MED ORDER — OLMESARTAN MEDOXOMIL 20 MG PO TABS: 10.0000 mg | ORAL_TABLET | Freq: Every day | ORAL | Status: DC

## 2013-03-13 NOTE — Telephone Encounter (Signed)
NEED APPOINTMENT Fax Received. Refill Completed. Kimble Hitchens Chowoe (R.M.A)   

## 2013-03-18 ENCOUNTER — Other Ambulatory Visit: Payer: Self-pay | Admitting: Internal Medicine

## 2013-04-16 ENCOUNTER — Other Ambulatory Visit: Payer: Self-pay | Admitting: *Deleted

## 2013-04-16 MED ORDER — METOPROLOL TARTRATE 50 MG PO TABS: ORAL_TABLET | ORAL | Status: DC

## 2013-04-16 NOTE — Telephone Encounter (Signed)
No refills until 06-12-13. Fax Received. Refill Completed. Jaz Mallick Chowoe (R.M.A)

## 2013-06-06 ENCOUNTER — Encounter: Payer: Self-pay | Admitting: Cardiovascular Disease

## 2013-06-12 ENCOUNTER — Encounter: Payer: Self-pay | Admitting: Cardiovascular Disease

## 2013-06-12 ENCOUNTER — Ambulatory Visit (INDEPENDENT_AMBULATORY_CARE_PROVIDER_SITE_OTHER): Payer: Medicare Other | Admitting: Cardiovascular Disease

## 2013-06-12 VITALS — BP 128/98 | HR 93 | Ht 68.0 in | Wt 132.4 lb

## 2013-06-12 DIAGNOSIS — I2510 Atherosclerotic heart disease of native coronary artery without angina pectoris: Secondary | ICD-10-CM

## 2013-06-12 MED ORDER — METOPROLOL TARTRATE 50 MG PO TABS: 25.0000 mg | ORAL_TABLET | Freq: Two times a day (BID) | ORAL | Status: DC

## 2013-06-12 MED ORDER — METOPROLOL TARTRATE 25 MG PO TABS: 25.0000 mg | ORAL_TABLET | Freq: Two times a day (BID) | ORAL | Status: DC

## 2013-06-12 NOTE — Assessment & Plan Note (Signed)
Cristian [name] is doing well.  No angina.  Will continue with his current meds.  Will check fasting lipids, liver enzymes, BMP in 6 months at his next ov.

## 2013-06-12 NOTE — Patient Instructions (Addendum)
Your physician has recommended you make the following change in your medication:  Increase metoprolol to 25 mg twice daily 12 hours apart  Your physician wants you to follow-up in: 6 months  You will receive a reminder letter in the mail two months in advance. If you don't receive a letter, please call our office to schedule the follow-up appointment.

## 2013-06-12 NOTE — Progress Notes (Signed)
Cristian [name] Date of Birth  [birth year] [place]     [place] [facility]  [address]    [address]   [address] [place], [place]  [address]    [place], [place]  [address] [phone]  Fax  [phone]  [phone]  Fax [phone]  Problem List: 1. CAD - CABG 2. COPD 3. HTN 4. Prostate Cancer 5. Hyperlipidemia.  History of Present Illness: Cristian [name] is an 79 y.o. gentleman with the above noted hx.  He denies any cardiac issues.  He has done well since his last visit.  He has lost about 10 lbs since his last visit.  He feels well.  June 12, 2013:  Cristian [name] has had prostate cancer since I last saw him.  He continues to have problems with COPD.    No cardiac issues.     Current Outpatient Prescriptions on File Prior to Visit  Medication Sig Dispense Refill  . acetaminophen (TYLENOL) 325 MG tablet Per bottle as needed for pain.       Marland Kitchen aspirin 81 MG tablet Take 81 mg by mouth daily.        Marland Kitchen CALCIUM-VITAMIN D PO Take 1 tablet by mouth daily.        Marland Kitchen co-enzyme Q-10 30 MG capsule Take 30 mg by mouth daily.        Marland Kitchen gabapentin (NEURONTIN) 100 MG capsule Take 100 mg by mouth daily.        . Ipratropium-Albuterol (COMBIVENT RESPIMAT) 20-100 MCG/ACT AERS respimat Inhale 1 puff into the lungs every 6 (six) hours.  1 Inhaler  0  . LORazepam (ATIVAN) 0.5 MG tablet 1-2 tabs by mouth at bedtime as needed for anxiety.       . Melatonin 3 MG TABS Take 1 tablet by mouth at bedtime as needed.       . Multiple Vitamins-Minerals (CENTRUM SILVER PO) Take 1 tablet by mouth daily.        . nitroGLYCERIN (NITROSTAT) 0.4 MG SL tablet Place 1 tablet (0.4 mg total) under the tongue every 5 (five) minutes as needed. May repeat x3.  25 tablet  11  . sertraline (ZOLOFT) 50 MG tablet Take 50 mg by mouth daily.        . SYMBICORT 160-4.5 MCG/ACT inhaler USE 2 PUFFS EVERY MORNING AND EVENING  10.2 g  0   No current facility-administered medications on file prior to visit.    No Known  Allergies  Past Medical History  Diagnosis Date  . Coronary artery disease   . Systolic dysfunction, left ventricle   . Hypertension   . COPD (chronic obstructive pulmonary disease)   . Hyperlipidemia   . Prostate cancer     Past Surgical History  Procedure Laterality Date  . Coronary artery bypass graft  1996  . Colonoscopy w/ polypectomy  2002  . Cardiac catheterization  2002    History  Smoking status  . Former Smoker -- 10 years  . Types: Pipe  . Quit date: 04/18/1981  Smokeless tobacco  . Never Used    History  Alcohol Use  . Yes    Comment: 1 vodka daily    No family history on file.  Reviw of Systems:  Reviewed in the HPI.  All other systems are negative.  Physical Exam: Blood pressure 128/98, pulse 93, height 5\' 8"  (1.727 m), weight 132 lb 6.4 oz (60.056 kg). General: Well developed, well nourished, in no acute distress.  Head: Normocephalic, atraumatic,  sclera non-icteric, mucus membranes are moist,   Neck: Supple. Negative for carotid bruits. JVD not elevated.  Lungs: Clear bilaterally to auscultation without wheezes, rales, or rhonchi. Lung sounds are decreased in the bases.  Heart: RRR with S1 S2. No murmurs, rubs, or gallops appreciated.  Abdomen: Soft, non-tender, non-distended with normoactive bowel sounds. No hepatomegaly. No rebound/guarding.    Msk:  Strength and tone appear normal for age.  Extremities: No clubbing or cyanosis. No edema.  Distal pedal pulses are 2+ and equal bilaterally.  Neuro: Alert and oriented X 3. Moves all extremities spontaneously.  Psych:  Responds to questions appropriately with a normal affect.  ECG: June 12, 2013:  NSR at 80 Assessment / Plan:

## 2013-06-20 ENCOUNTER — Other Ambulatory Visit: Payer: Self-pay | Admitting: Internal Medicine

## 2013-09-06 ENCOUNTER — Telehealth: Payer: Self-pay | Admitting: Internal Medicine

## 2013-09-06 NOTE — Telephone Encounter (Signed)
Pt last seen 09-2012. I advised i can leave a sample but he needs to set an appt. Appt set for 09-12-13 and pt states he has enough to last until then. Carron Curie, CMA

## 2013-09-10 ENCOUNTER — Other Ambulatory Visit: Payer: Self-pay

## 2013-09-10 MED ORDER — OLMESARTAN MEDOXOMIL 20 MG PO TABS: 10.0000 mg | ORAL_TABLET | Freq: Every day | ORAL | Status: DC

## 2013-09-12 ENCOUNTER — Ambulatory Visit (INDEPENDENT_AMBULATORY_CARE_PROVIDER_SITE_OTHER): Payer: Medicare Other | Admitting: Internal Medicine

## 2013-09-12 ENCOUNTER — Encounter: Payer: Self-pay | Admitting: Internal Medicine

## 2013-09-12 VITALS — BP 128/84 | HR 64 | Temp 97.5°F | Ht 68.0 in | Wt 135.0 lb

## 2013-09-12 DIAGNOSIS — J449 Chronic obstructive pulmonary disease, unspecified: Secondary | ICD-10-CM

## 2013-09-12 DIAGNOSIS — I10 Essential (primary) hypertension: Secondary | ICD-10-CM

## 2013-09-12 MED ORDER — VALSARTAN 160 MG PO TABS: 160.0000 mg | ORAL_TABLET | Freq: Every day | ORAL | Status: DC

## 2013-09-12 NOTE — Progress Notes (Signed)
Subjective:    Patient ID: Cristian [name], male    DOB: 14-Dec-1922    MRN: [id number]   Brief patient profile:  79 yowm quit smoking age 79 with chronic cough and evidence of reversible air flow obstruction by PFTs who did not improve on Spiriva, but seemed much better in terms of dyspnea on Advair, but worse in terms of hoarseness.  Chart review did indicate that he had egd with large hiatal hernia with esophageal stricture and GERD and Dr. Jarold Motto recommended that he maintain on Nexium beforeakfast daily, which he had not been doing consistently so rec PPI  regularly and switched from Advair to Symbicort and returned 06/04/08 stating that his breathing was the best it's been in years and his hoarseness  also much better.     HPI 07/18/2011 ov Gradual ? Flare Sick visit Dr. Sherene Sires pt: Pt c/o increased SOB with exertion x 1 week worse x last few days, productive cough with clear mucus, wheezing Prior flares seem to have responded to ABx and/ or prednisone. rec Ambulatory sats on RA Prednisone 10 mg x 1 week Get CXR report from dr Jacky Kindle We will send in referral to an exercise program - this may take a few weeks to come through    08/17/2011 f/u ov/Wert cc weak, can walk a block slowly s stopping, no cough,  Using combivent sev x daily Sleeping ok without nocturnal  or early am exac of resp c/o's or need for noct saba.  >>overnight ox done > ok      09/12/2013 f/u ov/Wert re: copd GOLD II Chief Complaint  Patient presents with  . Follow-up    Breathing unchanged since last OV, still having SOB and wheezing, tightness in chest with exertion  not really limited as much by breathing as by orthopedic restrictions. Rarely using combivent more than once a day on maint rx with symbicort 160 2bid  No obvious day to day or daytime variabilty or assoc chronic cough or cp or chest tightness, subjective wheeze overt sinus or hb symptoms. No unusual exp hx or h/o childhood pna/ asthma or  knowledge of premature birth.  Sleeping ok without nocturnal  or early am exacerbation  of respiratory  c/o's or need for noct saba. Also denies any obvious fluctuation of symptoms with weather or environmental changes or other aggravating or alleviating factors except as outlined above   Current Medications, Allergies, Complete Past Medical History, Past Surgical History, Family History, and Social History were reviewed in [employer] record.  ROS  The following are not active complaints unless bolded sore throat, dysphagia, dental problems, itching, sneezing,  nasal congestion or excess/ purulent secretions, ear ache,   fever, chills, sweats, unintended wt loss, pleuritic or exertional cp, hemoptysis,  orthopnea pnd or leg swelling, presyncope, palpitations, heartburn, abdominal pain, anorexia, nausea, vomiting, diarrhea  or change in bowel or urinary habits, change in stools or urine, dysuria,hematuria,  rash, arthralgias, visual complaints, headache, numbness weakness or ataxia or problems with walking or coordination,  change in mood/affect or memory.                 Past Medical History:  GERD (ICD-530.81)...................................................................Marland KitchenJarold Motto  - See positive endoscopy report 05/03/2006 showing esophageal stricture and hiatal hernia  CAD (ICD-414.00)  HYPERTENSION (ICD-401.9)  HYPERLIPIDEMIA (ICD-272.4)  COUGH, CHRONIC (ICD-786.2)  - Sinus CT neg March 02, 2010  COPD (ICD-496)..........................................................................Marland KitchenWert  - PFTs 06/04/08 FEV1 51% ratio 36% diffusing capacity 74% 12% response to bronchodilator  -  HFA 75% January 28, 2010 > 75% January 07, 2011  Complex med regimen 10/03/2011         Objective:   amb wm nad seems more weak than sob, trouble getting up on exam table, one person assist Wt 167 08/17/2011 >>166 10/03/2011 > 11/30/2011  159 > 02/28/2012  152 > 09/11/2012  148 >  135 09/12/2013  HEENT mild turbinate edema.  Oropharynx no thrush or excess pnd or cobblestoning.  No JVD or cervical adenopathy. Mild accessory muscle hypertrophy. Trachea midline, nl thryroid. Chest was hyperinflated by percussion with diminished breath sounds Regular rate and rhythm without murmur gallop or rub or increase P2 or edema.  Abd: no hsm, nl excursion. Ext warm without cyanosis or clubbing.        Assessment & Plan:

## 2013-09-12 NOTE — Assessment & Plan Note (Addendum)
-   PFTs 06/04/08 FEV1 51% ratio 36% diffusing capacity 74% 12% response to bronchodilator  - HFA 90% 11/30/2011  - ONO RA ok 08/22/11  I had an extended discussion with the patient today lasting 15 to 20 minutes of a 25 minute visit on the following issues:   As I explained to this patient in detail:  although there is  significant copd present, it does not appear to be limiting activity tolerance any more than a set of worn tires limits someone from driving a car  around a parking lot.  A new set of Michelins might look good but would have no perceived impact on the performance of the car and would not be worth the cost.  That is to say:   this pt is so sedentary I don't recommend aggressive pulmonary rx at this point unless limiting symptoms arise or acute exacerbations become as issue, neither of which are the case now.  I asked the patient to contact this office at any time in the future should either of these problems arise.     Each maintenance medication was reviewed in detail including most importantly the difference between maintenance and as needed and under what circumstances the prns are to be used. This was done in the context of a medication calendar review which provided the patient with a user-friendly unambiguous mechanism for medication administration and reconciliation and provides an action plan for all active problems. It is critical that this be shown to every doctor  for modification during the office visit if necessary so the patient can use it as a working document.

## 2013-09-12 NOTE — Assessment & Plan Note (Signed)
Having trouble cutting benicar in half > rec diovan 160 mg daily and if too strong defer to Dr Jacky Kindle further titration  Pulmonary f/u is prn

## 2013-09-12 NOTE — Patient Instructions (Addendum)
Diovan 160 one daily is a good substitute for the benicar   If you need to use your combivent more than twice daily    If you are satisfied with your treatment plan let your doctor know and he/she can either refill your medications or you can return here when your prescription runs out.     If in any way you are not 100% satisfied,  please tell us.  If 100% better, tell your friends!

## 2013-10-21 ENCOUNTER — Other Ambulatory Visit: Payer: Self-pay | Admitting: Internal Medicine

## 2014-01-12 ENCOUNTER — Other Ambulatory Visit: Payer: Self-pay | Admitting: Cardiovascular Disease

## 2014-02-07 ENCOUNTER — Ambulatory Visit: Payer: Medicare Other | Admitting: Cardiovascular Disease

## 2014-02-09 ENCOUNTER — Other Ambulatory Visit: Payer: Self-pay | Admitting: Cardiovascular Disease

## 2014-03-06 ENCOUNTER — Ambulatory Visit: Payer: Medicare Other | Admitting: Cardiovascular Disease

## 2014-03-07 ENCOUNTER — Ambulatory Visit (INDEPENDENT_AMBULATORY_CARE_PROVIDER_SITE_OTHER): Payer: Medicare Other | Admitting: Cardiovascular Disease

## 2014-03-07 ENCOUNTER — Encounter: Payer: Self-pay | Admitting: Cardiovascular Disease

## 2014-03-07 VITALS — BP 142/90 | HR 62 | Ht 68.0 in | Wt 113.0 lb

## 2014-03-07 DIAGNOSIS — I2510 Atherosclerotic heart disease of native coronary artery without angina pectoris: Secondary | ICD-10-CM

## 2014-03-07 DIAGNOSIS — J449 Chronic obstructive pulmonary disease, unspecified: Secondary | ICD-10-CM

## 2014-03-07 NOTE — Progress Notes (Signed)
Cristian [name] Date of Birth  [birth year] [place] [address]    [place], [place]  [address]    [place], [place]  [address] [phone]  Fax  [phone]  [phone]  Fax [phone]  Problem List: 1. CAD - CABG 2. COPD 3. HTN 4. Prostate Cancer 5. Hyperlipidemia.  History of Present Illness: Cristian [name] is an 80 y.o. gentleman with the above noted hx.  He denies any cardiac issues.  He has done well since his last visit.  He has lost about 10 lbs since his last visit.  He feels well.  June 12, 2013:  Cristian [name] has had prostate cancer since I last saw him.  He continues to have problems with COPD.    No cardiac issues.    March 07, 2014:  Having some problems with his hip.  He has some leg pain with walking.  No CP .    He occasionally has a right sided headache ( every couple of days)  He has lost some weight - he thinks about 20 lbs.  ( our scales were not working accurately today)   Current Outpatient Prescriptions on File Prior to Visit  Medication Sig Dispense Refill  . Apoaequorin (PREVAGEN PO) Take 1 tablet by mouth daily.      Marland Kitchen aspirin 81 MG tablet Take 81 mg by mouth daily.        Marland Kitchen CALCIUM-VITAMIN D PO Take 1 tablet by mouth daily.        Marland Kitchen co-enzyme Q-10 30 MG capsule Take 30 mg by mouth daily.        . COMBIVENT RESPIMAT 20-100 MCG/ACT AERS respimat INHALE 1 PUFF EVERY 6 HOURS AS NEEDED.  4 g  5  . gabapentin (NEURONTIN) 100 MG capsule Take 100 mg by mouth daily.        Marland Kitchen LORazepam (ATIVAN) 0.5 MG tablet 1-2 tabs by mouth at bedtime as needed for anxiety.       . Melatonin 3 MG TABS Take 1 tablet by mouth at bedtime as needed.       . metoprolol tartrate (LOPRESSOR) 25 MG tablet TAKE 1 TABLET TWICE DAILY.  60 tablet  0  . Multiple Vitamins-Minerals (CENTRUM SILVER PO) Take 1 tablet by mouth daily.        . nitroGLYCERIN (NITROSTAT) 0.4 MG SL tablet Place 1 tablet (0.4 mg total) under the tongue every  5 (five) minutes as needed. May repeat x3.  25 tablet  11  . olmesartan (BENICAR) 20 MG tablet Take 0.5 tablets (10 mg total) by mouth daily.  90 tablet  2  . sertraline (ZOLOFT) 50 MG tablet Take 50 mg by mouth daily.        . SYMBICORT 160-4.5 MCG/ACT inhaler USE 2 PUFFS EVERY MORNING AND EVENING  10.2 g  0  . valsartan (DIOVAN) 160 MG tablet Take 1 tablet (160 mg total) by mouth daily.  30 tablet  11   No current facility-administered medications on file prior to visit.    No Known Allergies  Past Medical History  Diagnosis Date  . Coronary artery disease   . Systolic dysfunction, left ventricle   . Hypertension   . COPD (chronic obstructive pulmonary disease)   . Hyperlipidemia   . Prostate cancer     Past Surgical History  Procedure Laterality Date  . Coronary artery bypass graft  1996  .  Colonoscopy w/ polypectomy  2002  . Cardiac catheterization  2002    History  Smoking status  . Former Smoker -- 10 years  . Types: Pipe  . Quit date: 04/18/1981  Smokeless tobacco  . Never Used    History  Alcohol Use  . Yes    Comment: 1 vodka daily    No family history on file.  Reviw of Systems:  Reviewed in the HPI.  All other systems are negative.  Physical Exam: Blood pressure 142/90, pulse 62, height 5\' 8"  (1.727 m), weight 113 lb (51.256 kg). General: Well developed, well nourished, in no acute distress.  Head: Normocephalic, atraumatic, sclera non-icteric, mucus membranes are moist,   Neck: Supple. Negative for carotid bruits. JVD not elevated.  Lungs: decreased breath sounds bilaterally.  Heart: RRR with S1 S2. No murmurs, rubs, or gallops appreciated.  Abdomen: Soft, non-tender, non-distended with normoactive bowel sounds. No hepatomegaly. No rebound/guarding.    Msk:  Strength and tone appear normal for age.  Extremities: No clubbing or cyanosis. No edema.  Distal pedal pulses are 2+ and equal bilaterally.  Neuro: Alert and oriented X 3. Moves all  extremities spontaneously.  Psych:  Responds to questions appropriately with a normal affect.  ECG: 03/07/2014: Normal sinus rhythm at 62. He has no ST or T wave changes. Assessment / Plan:

## 2014-03-07 NOTE — Assessment & Plan Note (Signed)
Cristian [name] is doing well. He's not having any episodes of chest pain. He does have chronic shortness breath which is likely due to her COPD.  He's generally doing well.  He slowly becoming more and more debilitated. He walks fairly slowly with the assistance of cane. He's lost a lot of muscle mass. I suspect that this is a normal aging process.

## 2014-03-07 NOTE — Patient Instructions (Signed)
Your physician wants you to follow-up in:  6 months. You will receive a reminder letter in the mail two months in advance. If you don't receive a letter, please call our office to schedule the follow-up appointment.   

## 2014-03-11 ENCOUNTER — Other Ambulatory Visit: Payer: Self-pay | Admitting: Cardiovascular Disease

## 2014-04-09 ENCOUNTER — Ambulatory Visit (INDEPENDENT_AMBULATORY_CARE_PROVIDER_SITE_OTHER): Payer: Medicare Other | Admitting: Cardiovascular Disease

## 2014-04-09 ENCOUNTER — Encounter: Payer: Self-pay | Admitting: Cardiovascular Disease

## 2014-04-09 VITALS — BP 152/85 | HR 61 | Ht 68.0 in | Wt 125.0 lb

## 2014-04-09 DIAGNOSIS — L97509 Non-pressure chronic ulcer of other part of unspecified foot with unspecified severity: Secondary | ICD-10-CM

## 2014-04-09 DIAGNOSIS — L98499 Non-pressure chronic ulcer of skin of other sites with unspecified severity: Secondary | ICD-10-CM

## 2014-04-09 DIAGNOSIS — I2510 Atherosclerotic heart disease of native coronary artery without angina pectoris: Secondary | ICD-10-CM

## 2014-04-09 DIAGNOSIS — IMO0002 Reserved for concepts with insufficient information to code with codable children: Secondary | ICD-10-CM

## 2014-04-09 NOTE — Patient Instructions (Signed)
Your physician recommends that you schedule a follow-up appointment in: As Needed or If Doppler comes back Abnormal  Your physician has requested that you have a lower extremity arterial duplex. This test is an ultrasound of the arteries in the legs. It looks at arterial blood flow in the legs and arms. Allow one hour for Lower Arterial scans. There are no restrictions or special instructions

## 2014-04-09 NOTE — Progress Notes (Signed)
04/09/2014 Cristian [name]   [birth year]  [id number]  Primary Physician ARONSON,RICHARD A, MD Primary Cardiologist: Lorretta Harp MD Renae Gloss   HPI:  Cristian [name] is a playful 80 year old thin and frail appearing widowed Caucasian male father of 2 children referred by Dr. Barkley Bruns from the [facility] for evaluation of a right second toe ulcer.his past history is remarkable for hypertension, hyperlipidemia, and CAD. He is status post remote coronary bypass grafting. He currently denies chest pain but does have dyspnea on exertion from COPD. He denies claudication. He has developed a right second toe ulcer followed by his podiatrist for Cristian [name] here for further vascular evaluation.   Current Outpatient Prescriptions  Medication Sig Dispense Refill  . Apoaequorin (PREVAGEN PO) Take 1 tablet by mouth daily.      Marland Kitchen aspirin 81 MG tablet Take 81 mg by mouth daily.        Marland Kitchen CALCIUM-VITAMIN D PO Take 1 tablet by mouth daily.        Marland Kitchen co-enzyme Q-10 30 MG capsule Take 30 mg by mouth daily.        . COMBIVENT RESPIMAT 20-100 MCG/ACT AERS respimat INHALE 1 PUFF EVERY 6 HOURS AS NEEDED.  4 g  5  . gabapentin (NEURONTIN) 100 MG capsule Take 100 mg by mouth daily.        Marland Kitchen LORazepam (ATIVAN) 0.5 MG tablet 1-2 tabs by mouth at bedtime as needed for anxiety.       . Melatonin 3 MG TABS Take 1 tablet by mouth at bedtime as needed.       . metoprolol tartrate (LOPRESSOR) 25 MG tablet TAKE 1 TABLET TWICE DAILY.  60 tablet  5  . Multiple Vitamins-Minerals (CENTRUM SILVER PO) Take 1 tablet by mouth daily.        . nitroGLYCERIN (NITROSTAT) 0.4 MG SL tablet Place 1 tablet (0.4 mg total) under the tongue every 5 (five) minutes as needed. May repeat x3.  25 tablet  11  . olmesartan (BENICAR) 20 MG tablet Take 0.5 tablets (10 mg total) by mouth daily.  90 tablet  2  . sertraline (ZOLOFT) 50 MG tablet Take 50 mg by mouth daily.        . SYMBICORT 160-4.5 MCG/ACT inhaler USE 2 PUFFS EVERY  MORNING AND EVENING  10.2 g  0  . valsartan (DIOVAN) 160 MG tablet Take 1 tablet (160 mg total) by mouth daily.  30 tablet  11   No current facility-administered medications for this visit.    No Known Allergies  History   Social History  . Marital Status: Widowed    Spouse Name: N/A    Number of Children: N/A  . Years of Education: N/A   Occupational History  . 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 Smoking status: Former Smoker -- 10 years    Types: Pipe    Quit date: 04/18/1981  . Smokeless tobacco: Never Used  . Alcohol Use: Yes     Comment: 1 vodka daily  . Drug Use: No  . Sexual Activity: Not on file   Other Topics Concern  . Not on file   Social History Narrative  . No narrative on file     Review of Systems: General: negative for chills, fever, night sweats or weight changes.  Cardiovascular: negative for chest pain, dyspnea on exertion, edema, orthopnea, palpitations, paroxysmal nocturnal dyspnea or shortness of breath  Dermatological: negative for rash Respiratory: negative for cough or wheezing Urologic: negative for hematuria Abdominal: negative for nausea, vomiting, diarrhea, bright red blood per rectum, melena, or hematemesis Neurologic: negative for visual changes, syncope, or dizziness All other systems reviewed and are otherwise negative except as noted above.    Blood pressure 152/85, pulse 61, height 5\' 8"  (1.727 m), weight 125 lb (56.7 kg).  General appearance: alert and no distress Neck: no adenopathy, no carotid bruit, no JVD, supple, symmetrical, trachea midline and thyroid not enlarged, symmetric, no tenderness/mass/nodules Lungs: clear to auscultation bilaterally Heart: regular rate and rhythm, S1, S2 normal, no murmur, click, rub or gallop Extremities: plan is as above the, bilateral palpable pedal pulses, right second toe ulcer  EKG not performed today  ASSESSMENT AND PLAN:   Toe ulcer The  patient was referred to me by Dr. [facility] for evaluation of a right second toe ulcer. He has a history of hypertension, hyperlipidemia and CAD status post remote CABG. Dr. Diona Browner is his cardiologist. He has developed over the last 4-6 weeks a right second toe ulcer that has not healed. He denies claudication. He has palpable pedal pulses. He does have cyanosis of his feet. I'm going to get lower extremity arterial Doppler studies.      Lorretta Harp MD FACP,FACC,FAHA, [facility], [place] 04/09/2014 3:01 PM

## 2014-04-09 NOTE — Assessment & Plan Note (Signed)
The patient was referred to me by Dr. Barkley Bruns from [facility] for evaluation of a right second toe ulcer. He has a history of hypertension, hyperlipidemia and CAD status post remote CABG. Dr. Diona Browner is his cardiologist. He has developed over the last 4-6 weeks a right second toe ulcer that has not healed. He denies claudication. He has palpable pedal pulses. He does have cyanosis of his feet. I'm going to get lower extremity arterial Doppler studies.

## 2014-04-16 ENCOUNTER — Encounter (HOSPITAL_COMMUNITY): Payer: Medicare Other

## 2014-04-21 ENCOUNTER — Ambulatory Visit (HOSPITAL_COMMUNITY)
Admission: RE | Admit: 2014-04-21 | Discharge: 2014-04-21 | Disposition: A | Discharge status: Home / Self Care | Payer: Medicare Other | Source: Ambulatory Visit | Attending: Cardiology | Admitting: Cardiology

## 2014-04-21 DIAGNOSIS — L97509 Non-pressure chronic ulcer of other part of unspecified foot with unspecified severity: Secondary | ICD-10-CM

## 2014-04-21 NOTE — Progress Notes (Signed)
Arterial Duplex Lower Ext. Completed. Alexsys Eskin, BS, RDMS, RVT  

## 2014-04-28 ENCOUNTER — Telehealth (HOSPITAL_COMMUNITY): Payer: Self-pay | Admitting: *Deleted

## 2014-04-29 ENCOUNTER — Encounter: Payer: Self-pay | Admitting: *Deleted

## 2014-07-25 ENCOUNTER — Other Ambulatory Visit (INDEPENDENT_AMBULATORY_CARE_PROVIDER_SITE_OTHER): Payer: Medicare Other

## 2014-07-25 ENCOUNTER — Ambulatory Visit (INDEPENDENT_AMBULATORY_CARE_PROVIDER_SITE_OTHER): Payer: Medicare Other | Admitting: Internal Medicine

## 2014-07-25 ENCOUNTER — Encounter: Payer: Self-pay | Admitting: Internal Medicine

## 2014-07-25 ENCOUNTER — Ambulatory Visit (INDEPENDENT_AMBULATORY_CARE_PROVIDER_SITE_OTHER)
Admission: RE | Admit: 2014-07-25 | Discharge: 2014-07-25 | Disposition: A | Discharge status: Home / Self Care | Payer: Medicare Other | Source: Ambulatory Visit | Attending: Internal Medicine | Admitting: Internal Medicine

## 2014-07-25 VITALS — BP 112/64 | HR 72 | Temp 97.8°F | Ht 67.0 in | Wt 123.0 lb

## 2014-07-25 DIAGNOSIS — R0600 Dyspnea, unspecified: Secondary | ICD-10-CM | POA: Insufficient documentation

## 2014-07-25 DIAGNOSIS — R0989 Other specified symptoms and signs involving the circulatory and respiratory systems: Secondary | ICD-10-CM

## 2014-07-25 DIAGNOSIS — I2510 Atherosclerotic heart disease of native coronary artery without angina pectoris: Secondary | ICD-10-CM

## 2014-07-25 DIAGNOSIS — R0609 Other forms of dyspnea: Secondary | ICD-10-CM

## 2014-07-25 DIAGNOSIS — J449 Chronic obstructive pulmonary disease, unspecified: Secondary | ICD-10-CM

## 2014-07-25 LAB — BRAIN NATRIURETIC PEPTIDE: Pro B Natriuretic peptide (BNP): 292 pg/mL — ABNORMAL HIGH (ref 0.0–100.0)

## 2014-07-25 LAB — CBC WITH DIFFERENTIAL/PLATELET
Basophils Absolute: 0 10*3/uL (ref 0.0–0.1)
Basophils Relative: 0.1 % (ref 0.0–3.0)
EOS ABS: 0.8 10*3/uL — AB (ref 0.0–0.7)
EOS PCT: 10.2 % — AB (ref 0.0–5.0)
HCT: 38.7 % — ABNORMAL LOW (ref 39.0–52.0)
Hemoglobin: 12.6 g/dL — ABNORMAL LOW (ref 13.0–17.0)
Lymphocytes Relative: 7.4 % — ABNORMAL LOW (ref 12.0–46.0)
Lymphs Abs: 0.6 10*3/uL — ABNORMAL LOW (ref 0.7–4.0)
MCHC: 32.6 g/dL (ref 30.0–36.0)
MCV: 88.7 fl (ref 78.0–100.0)
Monocytes Absolute: 1 10*3/uL (ref 0.1–1.0)
Monocytes Relative: 12.3 % — ABNORMAL HIGH (ref 3.0–12.0)
Neutro Abs: 5.7 10*3/uL (ref 1.4–7.7)
Neutrophils Relative %: 70 % (ref 43.0–77.0)
Platelets: 249 10*3/uL (ref 150.0–400.0)
RBC: 4.36 Mil/uL (ref 4.22–5.81)
RDW: 14.5 % (ref 11.5–15.5)
WBC: 8.2 10*3/uL (ref 4.0–10.5)

## 2014-07-25 LAB — BASIC METABOLIC PANEL
BUN: 30 mg/dL — ABNORMAL HIGH (ref 6–23)
CO2: 31 mEq/L (ref 19–32)
Calcium: 10 mg/dL (ref 8.4–10.5)
Chloride: 91 mEq/L — ABNORMAL LOW (ref 96–112)
Creatinine, Ser: 1.3 mg/dL (ref 0.4–1.5)
GFR: 57.82 mL/min — AB (ref >60.00)
GLUCOSE: 118 mg/dL — AB (ref 70–99)
Potassium: 4.6 mEq/L (ref 3.5–5.1)
SODIUM: 129 meq/L — AB (ref 135–145)

## 2014-07-25 LAB — TSH: TSH: 0.46 u[IU]/mL (ref 0.35–4.50)

## 2014-07-25 MED ORDER — PREDNISONE 10 MG PO TABS: ORAL_TABLET | ORAL | Status: DC

## 2014-07-25 NOTE — Progress Notes (Signed)
Subjective:    Patient ID: Cristian [name], male    DOB: May 18, 1923    MRN: [id number]   Brief patient profile:  80 yowm quit smoking age 80 with chronic cough and evidence of reversible air flow obstruction by PFTs who did not improve on Spiriva, but seemed much better in terms of dyspnea on Advair, but worse in terms of hoarseness.  Chart review did indicate that he had egd with large hiatal hernia with esophageal stricture and GERD and Dr. Sharlett Iles recommended that he maintain on Nexium beforeakfast daily, which he had not been doing consistently so rec PPI  regularly and switched from Advair to Symbicort and returned 06/04/08 stating that his breathing was the best it's been in years and his hoarseness  also much better.     HPI 07/18/2011 ov Gradual ? Flare Sick visit Dr. Melvyn Novas pt: Pt c/o increased SOB with exertion x 1 week worse x last few days, productive cough with clear mucus, wheezing Prior flares seem to have responded to ABx and/ or prednisone. rec Ambulatory sats on RA Prednisone 10 mg x 1 week Get CXR report from dr Reynaldo Minium We will send in referral to an exercise program - this may take a few weeks to come through    08/17/2011 f/u ov/Cristian [name] cc weak, can walk a block slowly s stopping, no cough,  Using combivent sev x daily Sleeping ok without nocturnal  or early am exac of resp c/o's or need for noct saba.  >>overnight ox done > ok      09/12/2013 f/u ov/Cristian [name] re: [place] Chief Complaint  Patient presents with  . Follow-up    Breathing unchanged since last OV, still having SOB and wheezing, tightness in chest with exertion  not really limited as much by breathing as by orthopedic restrictions. Rarely using combivent more than once a day on maint rx with symbicort 160 2bid rec Diovan 160 one daily is a good substitute for the benicar  If you need to use your combivent more than twice daily   07/25/2014 f/u ov/Cristian [name] re: stopped all gerd rx months ago  Chief  Complaint  Patient presents with  . Acute Visit    Pt c/o increased DOE x 2-3 wks. He gets out of breath walking approx 150 ft.  He has noticed some wheezing with exertion. Feels like he can not take in a good deep breath.  He is using combivent at least twice per day.    No cough or noct disturbance/ no purulent secretions - sob only with activity.  No obvious day to day or daytime variabilty or assoc   cp or chest tightness, subjective wheeze overt sinus or hb symptoms. No unusual exp hx or h/o childhood pna/ asthma or knowledge of premature birth.  Sleeping ok without nocturnal  or early am exacerbation  of respiratory  c/o's or need for noct saba. Also denies any obvious fluctuation of symptoms with weather or environmental changes or other aggravating or alleviating factors except as outlined above   Current Medications, Allergies, Complete Past Medical History, Past Surgical History, Family History, and Social History were reviewed in [employer] record.  ROS  The following are not active complaints unless bolded sore throat, dysphagia, dental problems, itching, sneezing,  nasal congestion or excess/ purulent secretions, ear ache,   fever, chills, sweats, unintended wt loss, pleuritic or exertional cp, hemoptysis,  orthopnea pnd or leg swelling, presyncope, palpitations, heartburn, abdominal pain, anorexia, nausea, vomiting, diarrhea  or change in bowel or urinary habits, change in stools or urine, dysuria,hematuria,  rash, arthralgias, visual complaints, headache, numbness weakness or ataxia or problems with walking or coordination,  change in mood/affect or memory.                 Past Medical History:  GERD (ICD-530.81)...................................................................Marland KitchenSharlett Iles  - See positive endoscopy report 05/03/2006 showing esophageal stricture and hiatal hernia  CAD (ICD-414.00)  HYPERTENSION (ICD-401.9)  HYPERLIPIDEMIA (ICD-272.4)   COUGH, CHRONIC (ICD-786.2)  - Sinus CT neg March 02, 2010  COPD (ICD-496)..........................................................................Marland KitchenWert  - PFTs 06/04/08 FEV1 51% ratio 36% diffusing capacity 74% 12% response to bronchodilator  - HFA 75% January 28, 2010 > 75% January 07, 2011  Complex med regimen 10/03/2011         Objective:   amb wm nad seems more weak than sob, trouble getting up on exam table, one person assist  Wt 167 08/17/2011 >>166 10/03/2011 > 11/30/2011  159 > 02/28/2012  152 > 09/11/2012  148 > 135 09/12/2013 >  07/25/2014  123   HEENT mild turbinate edema.  Oropharynx no thrush or excess pnd or cobblestoning.  No JVD or cervical adenopathy. Mild accessory muscle hypertrophy. Trachea midline, nl thryroid. Chest was hyperinflated by percussion with diminished breath sounds Regular rate and rhythm without murmur gallop or rub or increase P2 or edema.  Abd: no hsm, nl excursion. Ext warm without cyanosis or clubbing.      cxr 07/25/14  No pulmonary edema. Mild hyperinflation. Streaky interstitial  prominence in right upper lobe. Early infiltrate/pneumonitis cannot  be excluded. Follow-up examination is recommended to assure  Resolution. My review:  No as dz, no indication for rx    Recent Labs Lab 07/25/14 1502  NA 129*  K 4.6  CL 91*  CO2 31  BUN 30*  CREATININE 1.3  GLUCOSE 118*    Recent Labs Lab 07/25/14 1502  HGB 12.6*  HCT 38.7*  WBC 8.2  PLT 249.0      Lab Results  Component Value Date   TSH 0.46 07/25/2014     Lab Results  Component Value Date   PROBNP 292.0* 07/25/2014       Assessment & Plan:

## 2014-07-25 NOTE — Patient Instructions (Addendum)
Prednisone 10 mg take  4 each am x 2 days,   2 each am x 2 days,  1 each am x 2 days and stop  Try prilosec 20mg   Take 30-60 min before first meal of the day and Pepcid 20 mg one bedtime until cough is completely gone for at least a week without the need for cough suppression  I think of reflux for chronic cough like I do oxygen for fire (doesn't cause the fire but once you get the oxygen suppressed it usually goes away regardless of the exact cause).   See Tammy NP  in 2 weeks with all your medications   Add Doubt pna RUL > repeat cxr on return

## 2014-07-25 NOTE — Progress Notes (Signed)
Quick Note:  Spoke with pt and notified of results per Dr. Wert. Pt verbalized understanding and denied any questions.  ______ 

## 2014-07-27 NOTE — Assessment & Plan Note (Addendum)
-   PFTs 06/04/08 FEV1 51% ratio 36% diffusing capacity 74% 12% response to bronchodilator  - ONO RA ok 08/22/11    DDX of  difficult airways management all start with A and  include Adherence, Ace Inhibitors, Acid Reflux, Active Sinus Disease, Alpha 1 Antitripsin deficiency, Anxiety masquerading as Airways dz,  ABPA,  allergy(esp in young), Aspiration (esp in elderly), Adverse effects of DPI,  Active smokers, plus two Bs  = Bronchiectasis and Beta blocker use..and one C= CHF  Adherence is always the initial "prime suspect" and is a multilayered concern that requires a "trust but verify" approach in every patient - starting with knowing how to use medications, especially inhalers, correctly, keeping up with refills and understanding the fundamental difference between maintenance and prns vs those medications only taken for a very short course and then stopped and not refilled.  -  To keep things simple, I have asked the patient to first separate medicines that are perceived as maintenance, that is to be taken daily "no matter what", from those medicines that are taken on only on an as-needed basis and I have given the patient examples of both, and then return to see our NP to generate a  detailed  medication calendar which should be followed until the next physician sees the patient and updates it.  - The proper method of use, as well as anticipated side effects, of a metered-dose inhaler are discussed and demonstrated to the patient. Improved effectiveness after extensive coaching during this visit to a level of approximately  90% so continue present rx  ? Acid (or non-acid) GERD > always difficult to exclude as up to 75% of pts in some series report no assoc GI/ Heartburn symptoms> rec max (24h)  acid suppression and diet restrictions/ reviewed and instructions given in writing.   ? chf > bnp in intermediate range, may need echo if not improving    See instructions for specific recommendations which were  reviewed directly with the patient who was given a copy with highlighter outlining the key components.

## 2014-07-27 NOTE — Assessment & Plan Note (Signed)
?   chf > bnp in intermediate range, may need echo if not improving    Doubt pna RUL > repeat cxr on return

## 2014-07-28 ENCOUNTER — Telehealth: Payer: Self-pay | Admitting: Internal Medicine

## 2014-07-28 NOTE — Telephone Encounter (Signed)
Pt aware reports faxed over to PCP. Nothing further needed

## 2014-07-31 ENCOUNTER — Other Ambulatory Visit: Payer: Self-pay | Admitting: Cardiovascular Disease

## 2014-08-08 ENCOUNTER — Encounter: Payer: Medicare Other | Admitting: Adult Health

## 2014-08-22 ENCOUNTER — Ambulatory Visit (INDEPENDENT_AMBULATORY_CARE_PROVIDER_SITE_OTHER): Payer: Medicare Other | Admitting: Adult Health

## 2014-08-22 ENCOUNTER — Encounter: Payer: Self-pay | Admitting: Adult Health

## 2014-08-22 VITALS — BP 114/70 | HR 82 | Ht 67.0 in | Wt 121.6 lb

## 2014-08-22 DIAGNOSIS — Z23 Encounter for immunization: Secondary | ICD-10-CM

## 2014-08-22 DIAGNOSIS — J449 Chronic obstructive pulmonary disease, unspecified: Secondary | ICD-10-CM

## 2014-08-22 NOTE — Patient Instructions (Signed)
Flu shot today  Continue on current regimen  Follow up Dr. Melvyn Novas  In 3 months and As needed   Please contact office for sooner follow up if symptoms do not improve or worsen or seek emergency care

## 2014-08-22 NOTE — Assessment & Plan Note (Signed)
Recent flare now resolved  cxr w/ abn area along RUL , pt declines repeat xray  Advised will need in future -he will consider   Plan  Flu shot today  Continue on current regimen  Follow up Dr. Melvyn Novas  In 3 months and As needed   Please contact office for sooner follow up if symptoms do not improve or worsen or seek emergency care

## 2014-08-22 NOTE — Progress Notes (Signed)
Subjective:    Patient ID: Cristian [name], male    DOB: November 27, 1923    MRN: [id number]   Brief patient profile:  80 yowm quit smoking age 80 with chronic cough and evidence of reversible air flow obstruction by PFTs who did not improve on Spiriva, but seemed much better in terms of dyspnea on Advair, but worse in terms of hoarseness.  Chart review did indicate that he had egd with large hiatal hernia with esophageal stricture and GERD and Dr. Sharlett Iles recommended that he maintain on Nexium beforeakfast daily, which he had not been doing consistently so rec PPI  regularly and switched from Advair to Symbicort and returned 06/04/08 stating that his breathing was the best it's been in years and his hoarseness  also much better.     HPI 07/18/2011 ov Gradual ? Flare Sick visit Dr. Melvyn Novas pt: Pt c/o increased SOB with exertion x 1 week worse x last few days, productive cough with clear mucus, wheezing Prior flares seem to have responded to ABx and/ or prednisone. rec Ambulatory sats on RA Prednisone 10 mg x 1 week Get CXR report from dr Reynaldo Minium We will send in referral to an exercise program - this may take a few weeks to come through    08/17/2011 f/u ov/Wert cc weak, can walk a block slowly s stopping, no cough,  Using combivent sev x daily Sleeping ok without nocturnal  or early am exac of resp c/o's or need for noct saba.  >>overnight ox done > ok      09/12/2013 f/u ov/Wert re: [place] II Chief Complaint  Patient presents with  . Follow-up    Breathing unchanged since last OV, still having SOB and wheezing, tightness in chest with exertion  not really limited as much by breathing as by orthopedic restrictions. Rarely using combivent more than once a day on maint rx with symbicort 160 2bid rec Diovan 160 one daily is a good substitute for the benicar  If you need to use your combivent more than twice daily   07/25/2014 f/u ov/Wert re: stopped all gerd rx months ago  Chief  Complaint  Patient presents with  . Acute Visit    Pt c/o increased DOE x 2-3 wks. He gets out of breath walking approx 150 ft.  He has noticed some wheezing with exertion. Feels like he can not take in a good deep breath.  He is using combivent at least twice per day.    No cough or noct disturbance/ no purulent secretions - sob only with activity. >>>pred taper rx , CXR streaky prominence along RUL   08/22/2014 Follow up and Med review  Pt returns for 1 month follow up and med review  We reviewed his meds and updated his med list, does not have a few of his meds although seems to know his meds pretty good.  Had COPD flare last ov , tx w pred taper.  Says is feeling some better. No flare of cough or wheezing  Gets winded easily .  cxr w/ suggestion of streaky opacity in RUL , discussed repeating cxr today . He declines , I feel fine and do not want any more xrays .  Current Medications, Allergies, Complete Past Medical History, Past Surgical History, Family History, and Social History were reviewed in [facility] record.  ROS  The following are not active complaints unless bolded sore throat, dysphagia, dental problems, itching, sneezing,  nasal congestion or excess/ purulent secretions,  ear ache,   fever, chills, sweats, unintended wt loss, pleuritic or exertional cp, hemoptysis,  orthopnea pnd or leg swelling, presyncope, palpitations, heartburn, abdominal pain, anorexia, nausea, vomiting, diarrhea  or change in bowel or urinary habits, change in stools or urine, dysuria,hematuria,  rash, arthralgias, visual complaints, headache, numbness weakness or ataxia or problems with walking or coordination,  change in mood/affect or memory.                 Past Medical History:  GERD (ICD-530.81)...................................................................Marland KitchenSharlett Iles  - See positive endoscopy report 05/03/2006 showing esophageal stricture and hiatal hernia  CAD  (ICD-414.00)  HYPERTENSION (ICD-401.9)  HYPERLIPIDEMIA (ICD-272.4)  COUGH, CHRONIC (ICD-786.2)  - Sinus CT neg March 02, 2010  COPD (ICD-496)..........................................................................Marland KitchenWert  - PFTs 06/04/08 FEV1 51% ratio 36% diffusing capacity 74% 12% response to bronchodilator  - HFA 75% January 28, 2010 > 75% January 07, 2011  Complex med regimen 10/03/2011 , 08/22/2014         Objective:   amb wm nad seems more weak than sob, trouble getting up on exam table, one person assist  Wt 167 08/17/2011 >>166 10/03/2011 > 11/30/2011  159 > 02/28/2012  152 > 09/11/2012  148 > 135 09/12/2013 >  07/25/2014  123 >121  HEENT mild turbinate edema.  Oropharynx no thrush or excess pnd or cobblestoning.  No JVD or cervical adenopathy. Mild accessory muscle hypertrophy. Trachea midline, nl thryroid. Chest was hyperinflated by percussion with diminished breath sounds Regular rate and rhythm without murmur gallop or rub or increase P2 or edema.  Abd: no hsm, nl excursion. Ext warm without cyanosis or clubbing.      cxr 07/25/14  No pulmonary edema. Mild hyperinflation. Streaky interstitial  prominence in right upper lobe. Early infiltrate/pneumonitis cannot  be excluded. Follow-up examination is recommended to assure  Resolution. My review:  No as dz, no indication for rx    Recent Labs Lab 07/25/14 1502  NA 129*  K 4.6  CL 91*  CO2 31  BUN 30*  CREATININE 1.3  GLUCOSE 118*    Recent Labs Lab 07/25/14 1502  HGB 12.6*  HCT 38.7*  WBC 8.2  PLT 249.0      Lab Results  Component Value Date   TSH 0.46 07/25/2014     Lab Results  Component Value Date   PROBNP 292.0* 07/25/2014       Assessment & Plan:

## 2014-09-01 ENCOUNTER — Encounter: Payer: Self-pay | Admitting: Cardiovascular Disease

## 2014-09-01 ENCOUNTER — Ambulatory Visit (INDEPENDENT_AMBULATORY_CARE_PROVIDER_SITE_OTHER): Payer: Medicare Other | Admitting: Cardiovascular Disease

## 2014-09-01 ENCOUNTER — Other Ambulatory Visit: Payer: Self-pay | Admitting: Cardiovascular Disease

## 2014-09-01 VITALS — BP 130/82 | HR 66 | Ht 67.0 in | Wt 121.0 lb

## 2014-09-01 DIAGNOSIS — I10 Essential (primary) hypertension: Secondary | ICD-10-CM

## 2014-09-01 DIAGNOSIS — I2510 Atherosclerotic heart disease of native coronary artery without angina pectoris: Secondary | ICD-10-CM

## 2014-09-01 MED ORDER — METOPROLOL TARTRATE 25 MG PO TABS: 25.0000 mg | ORAL_TABLET | Freq: Two times a day (BID) | ORAL | Status: AC

## 2014-09-01 MED ORDER — OLMESARTAN MEDOXOMIL 5 MG PO TABS: ORAL_TABLET | ORAL | Status: AC

## 2014-09-01 MED ORDER — OLMESARTAN MEDOXOMIL 5 MG PO TABS: 5.0000 mg | ORAL_TABLET | Freq: Two times a day (BID) | ORAL | Status: DC

## 2014-09-01 NOTE — Progress Notes (Signed)
Cristian [name] Date of Birth  March 22, 1923 [place] [address]    [place]   [place], [place]  [address]    [place], [place]  [address] [phone]  Fax  [phone]  [phone]  Fax [phone]  Problem List: 1. CAD - CABG 2. COPD 3. HTN 4. Prostate Cancer 5. Hyperlipidemia.  History of Present Illness: Cristian [name] is an 80 y.o. gentleman with the above noted hx.  He denies any cardiac issues.  He has done well since his last visit.  He has lost about 10 lbs since his last visit.  He feels well.  June 12, 2013:  Cristian [name] has had prostate cancer since I last saw him.  He continues to have problems with COPD.    No cardiac issues.    March 07, 2014:  Having some problems with his hip.  He has some leg pain with walking.  No CP .    He occasionally has a right sided headache ( every couple of days)  He has lost some weight - he thinks about 20 lbs.  ( our scales were not working accurately today)   Sept. 21, 2015:  Cristian [name] is seen today for follow up.  No cardiac concerns.    Current Outpatient Prescriptions on File Prior to Visit  Medication Sig Dispense Refill  . Apoaequorin (PREVAGEN PO) Take 1 tablet by mouth daily.      Marland Kitchen aspirin 81 MG tablet Take 81 mg by mouth daily.        Marland Kitchen BENICAR 5 MG tablet TAKE (2) TABLETS DAILY.  60 tablet  0  . co-enzyme Q-10 30 MG capsule Take 60 mg by mouth daily.       . COMBIVENT RESPIMAT 20-100 MCG/ACT AERS respimat INHALE 1 PUFF EVERY 6 HOURS AS NEEDED.  4 g  5  . gabapentin (NEURONTIN) 100 MG capsule Take 100 mg by mouth 3 (three) times daily.       Marland Kitchen loratadine (CLARITIN) 10 MG tablet Take 10 mg by mouth daily as needed for allergies.      Marland Kitchen LORazepam (ATIVAN) 0.5 MG tablet 1-2 tabs by mouth at bedtime as needed for anxiety.       . Melatonin 3 MG TABS Take 1 tablet by mouth at bedtime as needed.       . metoprolol tartrate (LOPRESSOR) 25 MG tablet TAKE 1 TABLET TWICE DAILY.  60 tablet   5  . Multiple Vitamins-Minerals (CENTRUM SILVER PO) Take 1 tablet by mouth daily.        . Multiple Vitamins-Minerals (PRESERVISION AREDS 2) CAPS Take 1 capsule by mouth daily.      . nitroGLYCERIN (NITROSTAT) 0.4 MG SL tablet Place 1 tablet (0.4 mg total) under the tongue every 5 (five) minutes as needed. May repeat x3.  25 tablet  11  . predniSONE (DELTASONE) 10 MG tablet Take  4 each am x 2 days,   2 each am x 2 days,  1 each am x 2 days and stop  14 tablet  0  . Probiotic Product (PROBIOTIC DAILY) CAPS Take 1 capsule by mouth daily as needed.      . SYMBICORT 160-4.5 MCG/ACT inhaler USE 2 PUFFS EVERY MORNING AND EVENING  10.2 g  0  . triamcinolone (NASACORT ALLERGY 24HR) 55 MCG/ACT AERO nasal inhaler Place 1 spray into the nose 2 (two) times daily.  No current facility-administered medications on file prior to visit.    No Known Allergies  Past Medical History  Diagnosis Date  . Coronary artery disease   . Systolic dysfunction, left ventricle   . Hypertension   . COPD (chronic obstructive pulmonary disease)   . Hyperlipidemia   . Prostate cancer   . Toe ulcer     Past Surgical History  Procedure Laterality Date  . Coronary artery bypass graft  1996  . Colonoscopy w/ polypectomy  2002  . Cardiac catheterization  2002    History  Smoking status  . Former Smoker -- 10 years  . Types: Pipe  . Quit date: 04/18/1981  Smokeless tobacco  . Never Used    History  Alcohol Use  . Yes    Comment: 1 vodka daily    No family history on file.  Reviw of Systems:  Reviewed in the HPI.  All other systems are negative.  Physical Exam: Blood pressure 130/82, pulse 66, height 5\' 7"  (1.702 m), weight 121 lb (54.885 kg). General: Well developed, well nourished, in no acute distress.  Head: Normocephalic, atraumatic, sclera non-icteric, mucus membranes are moist,   Neck: Supple. Negative for carotid bruits. JVD not elevated.  Lungs: decreased breath sounds  bilaterally.  Heart: RRR with S1 S2. No murmurs, rubs, or gallops appreciated.  Abdomen: Soft, non-tender, non-distended with normoactive bowel sounds. No hepatomegaly. No rebound/guarding.    Msk:  Strength and tone appear normal for age.  Extremities: No clubbing or cyanosis. No edema.  Distal pedal pulses are 2+ and equal bilaterally.  Neuro: Alert and oriented X 3. Moves all extremities spontaneously.  Psych:  Responds to questions appropriately with a normal affect.  ECG: 03/07/2014: Normal sinus rhythm at 62. He has no ST or T wave changes. Assessment / Plan:

## 2014-09-01 NOTE — Assessment & Plan Note (Signed)
Continue current medications. He's doing well from a cardiac standpoint. He is slowing down from an general medical standpoint. I will  see him in 6 months.

## 2014-09-01 NOTE — Patient Instructions (Signed)
Your physician recommends that you continue on your current medications as directed. Please refer to the Current Medication list given to you today.  Your physician wants you to follow-up in: 6 months with Dr. Nahser.  You will receive a reminder letter in the mail two months in advance. If you don't receive a letter, please call our office to schedule the follow-up appointment.  

## 2014-09-01 NOTE — Assessment & Plan Note (Signed)
BP has been stable

## 2014-12-02 ENCOUNTER — Other Ambulatory Visit: Payer: Self-pay | Admitting: Internal Medicine

## 2014-12-24 ENCOUNTER — Ambulatory Visit (INDEPENDENT_AMBULATORY_CARE_PROVIDER_SITE_OTHER): Payer: Medicare Other | Admitting: Internal Medicine

## 2014-12-24 ENCOUNTER — Encounter: Payer: Self-pay | Admitting: Internal Medicine

## 2014-12-24 VITALS — BP 122/80 | HR 62 | Ht 68.0 in | Wt 128.0 lb

## 2014-12-24 DIAGNOSIS — J449 Chronic obstructive pulmonary disease, unspecified: Secondary | ICD-10-CM

## 2014-12-24 NOTE — Progress Notes (Signed)
Subjective:    Patient ID: Cristian [name], male    DOB: 07/15/1923    MRN: [id number]   Brief patient profile:  81 yowm quit smoking age 81 with chronic cough and evidence of reversible air flow obstruction by PFTs who did not improve on Spiriva, but seemed much better in terms of dyspnea on Advair, but worse in terms of hoarseness.  Chart review did indicate that he had egd with large hiatal hernia with esophageal stricture and GERD and Dr. Sharlett Iles recommended that he maintain on Nexium beforeakfast daily, which he had not been doing consistently so rec PPI  regularly and switched from Advair to Symbicort and returned 06/04/08 stating that his breathing was the best it's been in years and his hoarseness  also much better.     HPI 07/18/2011 ov Gradual ? Flare Sick visit Dr. Melvyn Novas pt: Pt c/o increased SOB with exertion x 1 week worse x last few days, productive cough with clear mucus, wheezing Prior flares seem to have responded to ABx and/ or prednisone. rec Ambulatory sats on RA Prednisone 10 mg x 1 week Get CXR report from dr Reynaldo Minium We will send in referral to an exercise program - this may take a few weeks to come through    08/22/2014 Follow up and Med review  Pt returns for 1 month follow up and med review  We reviewed his meds and updated his med list, does not have a few of his meds although seems to know his meds pretty good.  Had COPD flare last ov , tx w pred taper.  Says is feeling some better. No flare of cough or wheezing  Gets winded easily .  cxr w/ suggestion of streaky opacity in RUL , discussed repeating cxr today . He declines , I feel fine and do not want any more xrays rec No change rx    12/24/2014 f/u ov/Cristian [name] re: GOLD II COPD cc   I got a letter re refills   Very sedentary but Not limited by breathing from desired activities  - mod hoarsness on symbicort 160 bid and prn combivent which doesn't use much at all  No obvious day to day or daytime variabilty or  assoc chronic cough or cp or chest tightness, subjective wheeze overt sinus or hb symptoms. No unusual exp hx or h/o childhood pna/ asthma or knowledge of premature birth.  Sleeping ok without nocturnal  or early am exacerbation  of respiratory  c/o's or need for noct saba. Also denies any obvious fluctuation of symptoms with weather or environmental changes or other aggravating or alleviating factors except as outlined above   Current Medications, Allergies, Complete Past Medical History, Past Surgical History, Family History, and Social History were reviewed in [employer] [facility] record.  ROS  The following are not active complaints unless bolded sore throat, dysphagia, dental problems, itching, sneezing,  nasal congestion or excess/ purulent secretions, ear ache,   fever, chills, sweats, unintended wt loss, pleuritic or exertional cp, hemoptysis,  orthopnea pnd or leg swelling, presyncope, palpitations, heartburn, abdominal pain, anorexia, nausea, vomiting, diarrhea  or change in bowel or urinary habits, change in stools or urine, dysuria,hematuria,  rash, arthralgias, visual complaints, headache, numbness weakness or ataxia or problems with walking or coordination,  change in mood/affect or memory.        Past Medical History:  GERD (ICD-530.81)...................................................................Marland KitchenSharlett Iles  - See positive endoscopy report 05/03/2006 showing esophageal stricture and hiatal hernia  CAD (ICD-414.00)  HYPERTENSION (ICD-401.9)  HYPERLIPIDEMIA (ICD-272.4)  COUGH, CHRONIC (ICD-786.2)  - Sinus CT neg March 02, 2010  COPD (ICD-496)..........................................................................Marland KitchenWert  - PFTs 06/04/08 FEV1 51% ratio 36% diffusing capacity 74% 12% response to bronchodilator  - HFA 75% January 28, 2010 > 75% January 07, 2011  Complex med regimen 10/03/2011 , 08/22/2014         Objective:   amb wm nad seems more weak than  sob, trouble getting up on exam table, one person assist  Wt 167 08/17/2011 >>166 10/03/2011 > 11/30/2011  159 > 02/28/2012  152 > 09/11/2012  148 > 135 09/12/2013 >  07/25/2014  123 >  12/24/2014   128   HEENT mild turbinate edema.  Oropharynx no thrush or excess pnd or cobblestoning.  No JVD or cervical adenopathy. Mild accessory muscle hypertrophy. Trachea midline, nl thryroid. Chest was hyperinflated by percussion with diminished breath sounds Regular rate and rhythm without murmur gallop or rub or increase P2 or edema.  Abd: no hsm, nl excursion. Ext warm without cyanosis or clubbing.      cxr 07/25/14  No pulmonary edema. Mild hyperinflation. Streaky interstitial  prominence in right upper lobe. Early infiltrate/pneumonitis cannot  be excluded. Follow-up examination is recommended to assure  Resolution. My review:  No as dz, no indication for rx    Recent Labs Lab 07/25/14 1502  NA 129*  K 4.6  CL 91*  CO2 31  BUN 30*  CREATININE 1.3  GLUCOSE 118*    Recent Labs Lab 07/25/14 1502  HGB 12.6*  HCT 38.7*  WBC 8.2  PLT 249.0      Lab Results  Component Value Date   TSH 0.46 07/25/2014     Lab Results  Component Value Date   PROBNP 292.0* 07/25/2014       Assessment & Plan:

## 2014-12-24 NOTE — Patient Instructions (Addendum)
It may well be your symbicort could be reducted to 80 strength to help your hoarseness but I will defer that to your primary care doctor as it appears he has been refilling it for you  Your only other medication is combivent which you only use as needed but eventually it will need to be refilled    If you are satisfied with your treatment plan,  let your doctor know and he can either refill your medications or you can return here when your prescription runs out.     If in any way you are not 100% satisfied,  please tell us.  If 100% better, tell your friends!  Pulmonary follow up is as needed

## 2014-12-29 ENCOUNTER — Encounter: Payer: Self-pay | Admitting: Internal Medicine

## 2014-12-29 IMAGING — CR DG CHEST 2V
3 series · 3 of 3 positions shown · non-contrast
Comparison: 01/28/2010

CLINICAL DATA: Shortness of Breath

EXAM:
CHEST  2 VIEW

[view not recorded (1 of 3)]
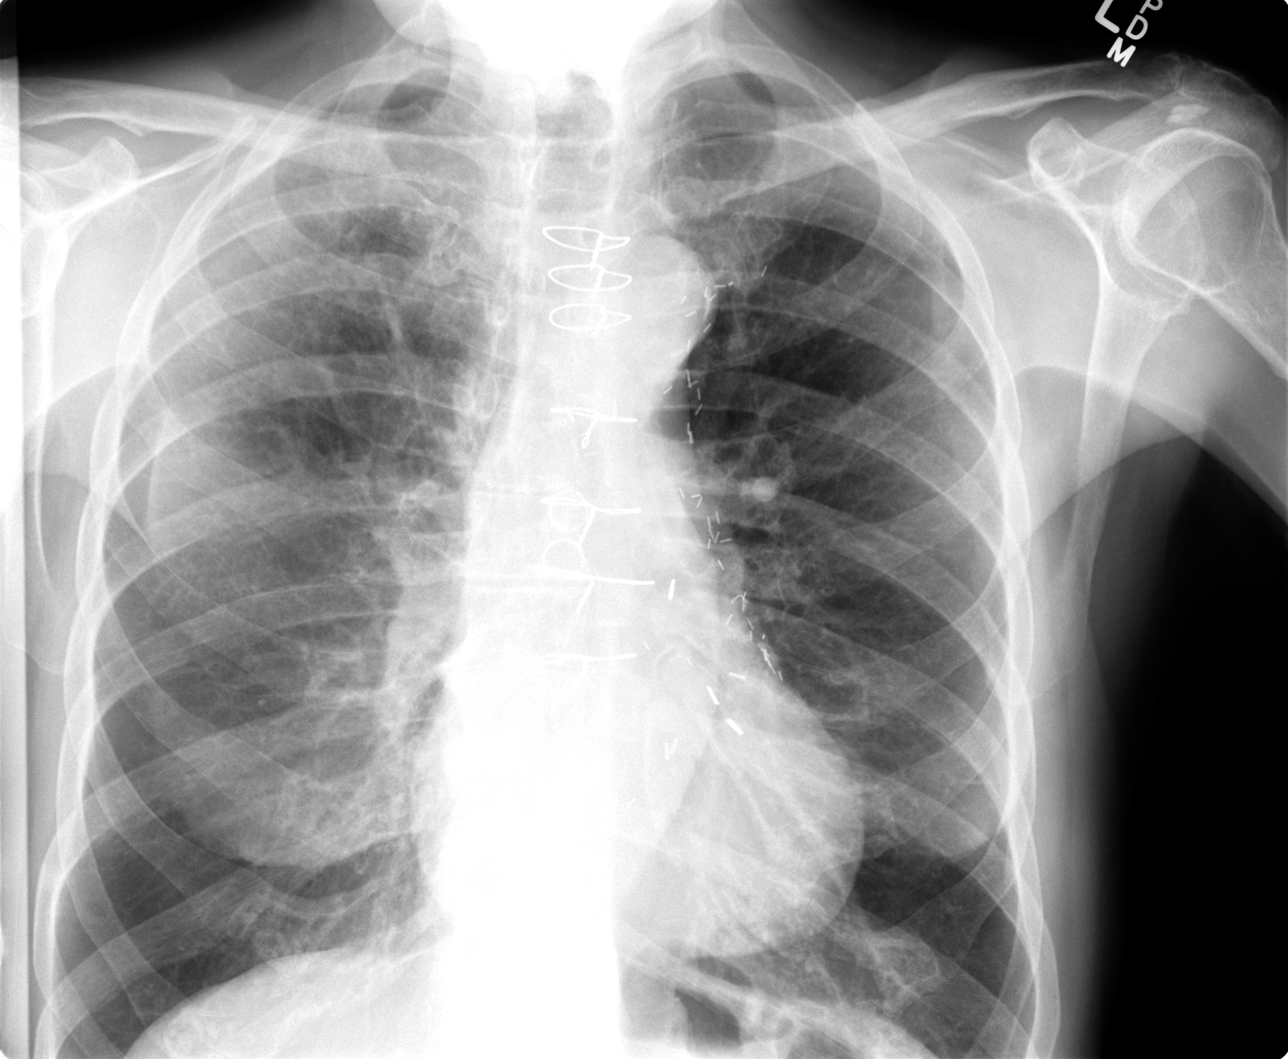

[view not recorded (2 of 3)]
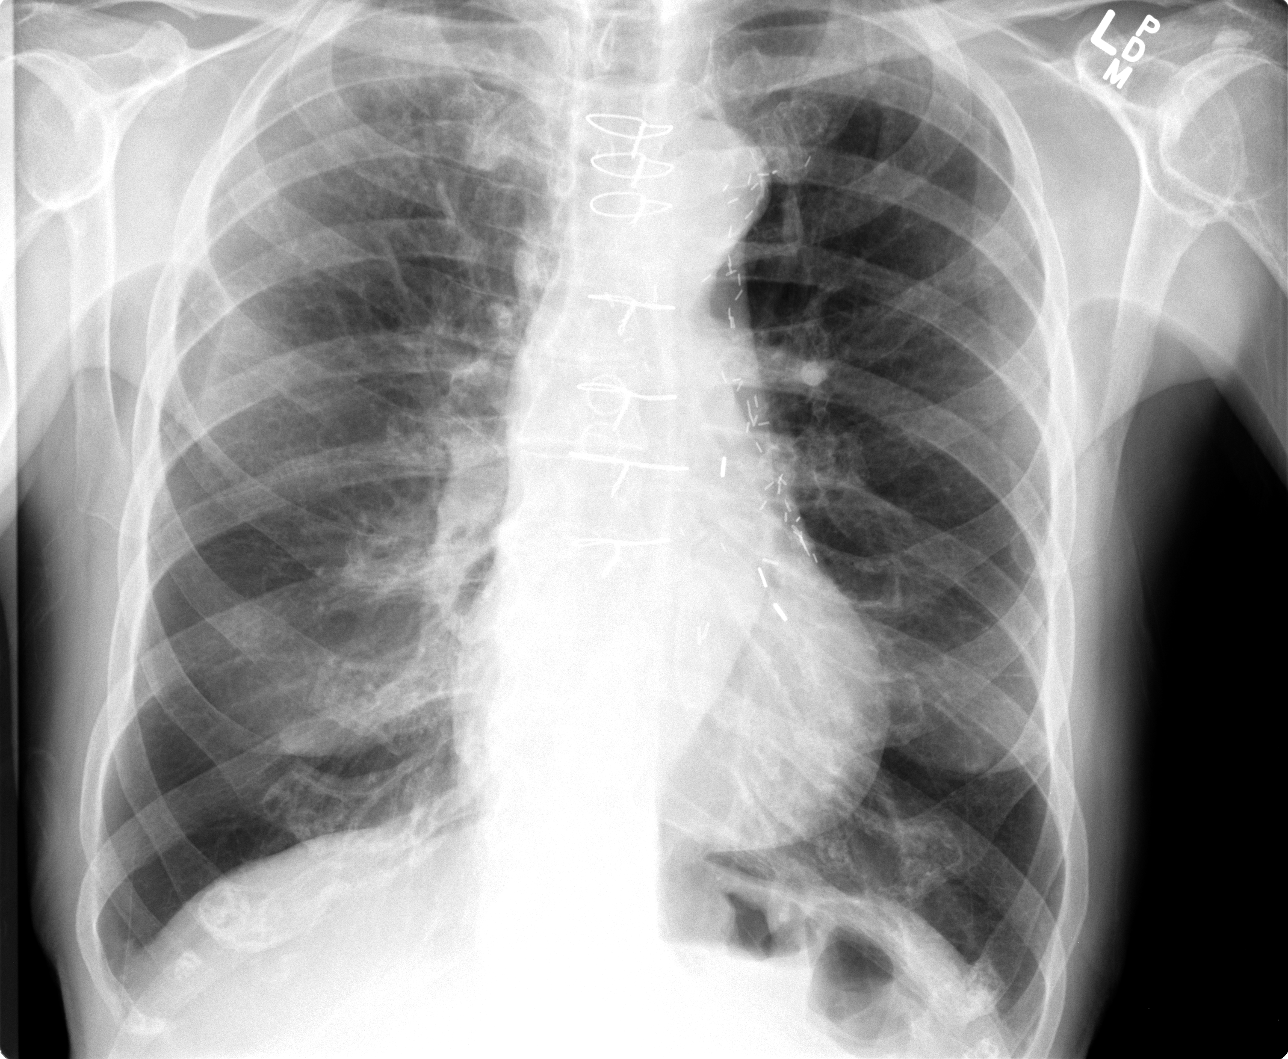

[view not recorded (3 of 3)]
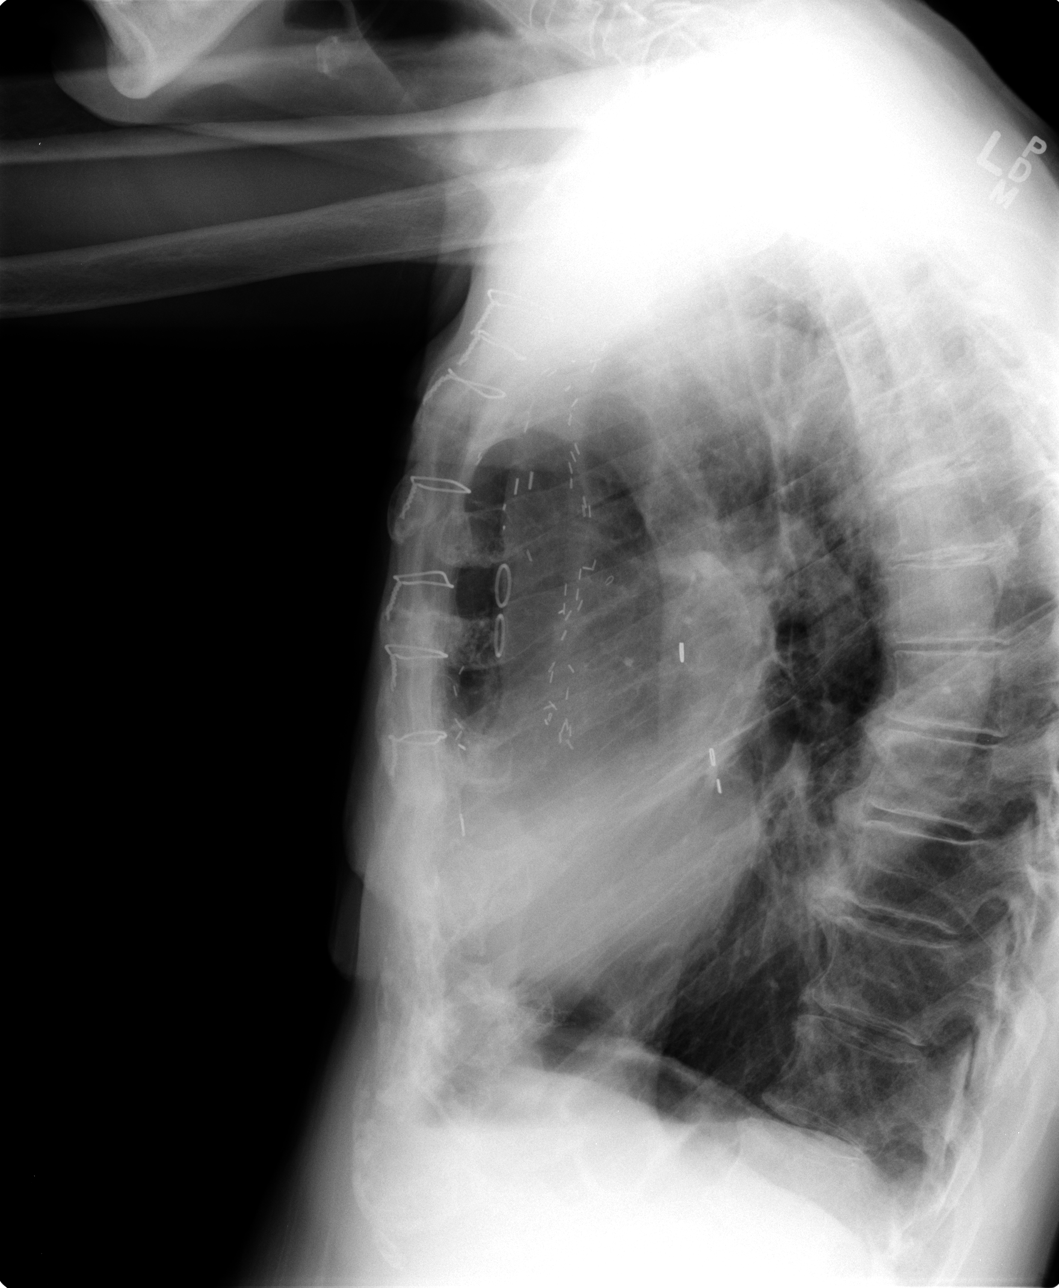

[3 of 3 positions shown; findings below may reference images not displayed]

FINDINGS: Cardiomediastinal silhouette is stable. Mild hyperinflation. Status
post CABG. There is streaky interstitial prominence in right upper
lobe. Early infiltrate/pneumonitis cannot be excluded. Clinical
correlation is necessary. Follow-up examination to assure resolution
is recommended. No pulmonary edema.
IMPRESSION: No pulmonary edema. Mild hyperinflation. Streaky interstitial
prominence in right upper lobe. Early infiltrate/pneumonitis cannot
be excluded. Follow-up examination is recommended to assure
resolution.

## 2014-12-29 NOTE — Assessment & Plan Note (Signed)
-   PFTs 06/04/08 FEV1 51% ratio 36% diffusing capacity 74% 12% response to bronchodilator  - ONO RA ok 08/22/11 - 07/25/2014 p extensive coaching HFA effectiveness =    90%   I had an extended summary discussion with the patient reviewing all relevant studies completed to date and  lasting 15 to 20 minutes of a 25 minute visit on the following ongoing concerns:   As I explained to this patient in detail:  although there is significant  copd present, it may not be clinically all that important:  it does not appear to be limiting activity tolerance any more than a set of worn tires limits someone from driving a car  around a parking lot.  A new set of Michelins might look good but would have no perceived impact on the performance of the car and would not be worth the cost.  That is to say:   this pt is so sedentary I don't recommend aggressive pulmonary rx at this point unless limiting symptoms arise or acute exacerbations become as issue, neither of which is the case now.  I asked the patient to contact this office at any time in the future should either of these problems arise.    In fact should be ok to reduce symbicort to 80 2bid   Pulmonary f/u can be prn

## 2015-03-04 ENCOUNTER — Ambulatory Visit: Payer: Medicare Other | Admitting: Cardiovascular Disease

## 2015-05-17 ENCOUNTER — Other Ambulatory Visit: Payer: Self-pay | Admitting: Internal Medicine

## 2015-07-01 ENCOUNTER — Other Ambulatory Visit: Payer: Self-pay | Admitting: Internal Medicine

## 2016-01-13 DEATH — deceased
